# Patient Record
Sex: Male | Born: 1945 | Race: Black or African American | Hispanic: No | Marital: Married | Smoking: Never smoker
Health system: Southern US, Community
[De-identification: ages and names within clinical notes are randomized; demographics above are authoritative.]

## PROBLEM LIST (undated history)

## (undated) DIAGNOSIS — Z8739 Personal history of other diseases of the musculoskeletal system and connective tissue: Secondary | ICD-10-CM

## (undated) DIAGNOSIS — L819 Disorder of pigmentation, unspecified: Secondary | ICD-10-CM

## (undated) DIAGNOSIS — I10 Essential (primary) hypertension: Secondary | ICD-10-CM

## (undated) DIAGNOSIS — E119 Type 2 diabetes mellitus without complications: Secondary | ICD-10-CM

## (undated) DIAGNOSIS — C61 Malignant neoplasm of prostate: Secondary | ICD-10-CM

## (undated) HISTORY — PX: CATARACT EXTRACTION: SUR2

---

## 2000-05-27 ENCOUNTER — Emergency Department (HOSPITAL_COMMUNITY): Admission: AC | Admit: 2000-05-27 | Discharge: 2000-05-27 | Payer: Self-pay

## 2002-01-29 ENCOUNTER — Ambulatory Visit (HOSPITAL_COMMUNITY): Admission: RE | Admit: 2002-01-29 | Discharge: 2002-01-29 | Payer: Self-pay | Admitting: *Deleted

## 2006-07-30 ENCOUNTER — Encounter: Admission: RE | Admit: 2006-07-30 | Discharge: 2006-07-30 | Payer: Self-pay | Admitting: Internal Medicine

## 2015-03-24 ENCOUNTER — Other Ambulatory Visit: Payer: Self-pay | Admitting: Urology

## 2015-03-25 ENCOUNTER — Other Ambulatory Visit: Payer: Self-pay | Admitting: Urology

## 2015-04-08 NOTE — Patient Instructions (Addendum)
YOUR PROCEDURE IS SCHEDULED ON :  04/18/15  REPORT TO [place] MAIN ENTRANCE FOLLOW SIGNS TO EAST ELEVATOR - GO TO 3rd FLOOR CHECK IN AT 3 EAST NURSES STATION (SHORT STAY) AT:  9:30 AM  CALL THIS NUMBER IF YOU HAVE PROBLEMS THE MORNING OF SURGERY [phone]  REMEMBER:ONLY 1 PER PERSON MAY GO TO SHORT STAY WITH YOU TO GET READY THE MORNING OF YOUR SURGERY  DO NOT EAT FOOD OR DRINK LIQUIDS AFTER MIDNIGHT  TAKE THESE MEDICINES THE MORNING OF SURGERY: NONE  YOU MAY NOT HAVE ANY METAL ON YOUR BODY INCLUDING HAIR PINS AND PIERCING'S. DO NOT WEAR JEWELRY, MAKEUP, LOTIONS, POWDERS OR PERFUMES. DO NOT WEAR NAIL POLISH. DO NOT SHAVE 48 HRS PRIOR TO SURGERY. MEN MAY SHAVE FACE AND NECK.  DO NOT [place]. [facility] IS NOT RESPONSIBLE FOR VALUABLES.  CONTACTS, DENTURES OR PARTIALS MAY NOT BE WORN TO SURGERY. LEAVE SUITCASE IN CAR. CAN BE BROUGHT TO ROOM AFTER SURGERY.  PATIENTS DISCHARGED THE DAY OF SURGERY WILL NOT BE ALLOWED TO DRIVE HOME.  PLEASE READ OVER THE FOLLOWING INSTRUCTION SHEETS _________________________________________________________________________________                                          Andrew Ho - PREPARING FOR SURGERY  Before surgery, you can play an important role.  Because skin is not sterile, your skin needs to be as free of germs as possible.  You can reduce the number of germs on your skin by washing with CHG (chlorahexidine gluconate) soap before surgery.  CHG is an antiseptic cleaner which kills germs and bonds with the skin to continue killing germs even after washing. Please DO NOT use if you have an allergy to CHG or antibacterial soaps.  If your skin becomes reddened/irritated stop using the CHG and inform your nurse when you arrive at Short Stay. Do not shave (including legs and underarms) for at least 48 hours prior to the first CHG shower.  You may shave your face. Please follow these instructions  carefully:   1.  Shower with CHG Soap the night before surgery and the  morning of Surgery.   2.  If you choose to wash your hair, wash your hair first as usual with your  normal  Shampoo.   3.  After you shampoo, rinse your hair and body thoroughly to remove the  shampoo.                                         4.  Use CHG as you would any other liquid soap.  You can apply chg directly  to the skin and wash . Gently wash with scrungie or clean wascloth    5.  Apply the CHG Soap to your body ONLY FROM THE NECK DOWN.   Do not use on open                           Wound or open sores. Avoid contact with eyes, ears mouth and genitals (private parts).                        Genitals (private parts) with your normal soap.  6.  Wash thoroughly, paying special attention to the area where your surgery  will be performed.   7.  Thoroughly rinse your body with warm water from the neck down.   8.  DO NOT shower/wash with your normal soap after using and rinsing off  the CHG Soap .                9.  Pat yourself dry with a clean towel.             10.  Wear clean night clothes to bed after shower             11.  Place clean sheets on your bed the night of your first shower and do not  sleep with pets.  Day of Surgery : Do not apply any lotions/deodorants the morning of surgery.  Please wear clean clothes to the hospital/surgery center.  FAILURE TO FOLLOW THESE INSTRUCTIONS MAY RESULT IN THE CANCELLATION OF YOUR SURGERY    PATIENT SIGNATURE_________________________________  ______________________________________________________________________     Andrew Ho  An incentive spirometer is a tool that can help keep your lungs clear and active. This tool measures how well you are filling your lungs with each breath. Taking long deep breaths may help reverse or decrease the chance of developing breathing (pulmonary) problems (especially infection) following:  A long  period of time when you are unable to move or be active. BEFORE THE PROCEDURE   If the spirometer includes an indicator to show your best effort, your nurse or respiratory therapist will set it to a desired goal.  If possible, sit up straight or lean slightly forward. Try not to slouch.  Hold the incentive spirometer in an upright position. INSTRUCTIONS FOR USE   Sit on the edge of your bed if possible, or sit up as far as you can in bed or on a chair.  Hold the incentive spirometer in an upright position.  Breathe out normally.  Place the mouthpiece in your mouth and seal your lips tightly around it.  Breathe in slowly and as deeply as possible, raising the piston or the ball toward the top of the column.  Hold your breath for 3-5 seconds or for as long as possible. Allow the piston or ball to fall to the bottom of the column.  Remove the mouthpiece from your mouth and breathe out normally.  Rest for a few seconds and repeat Steps 1 through 7 at least 10 times every 1-2 hours when you are awake. Take your time and take a few normal breaths between deep breaths.  The spirometer may include an indicator to show your best effort. Use the indicator as a goal to work toward during each repetition.  After each set of 10 deep breaths, practice coughing to be sure your lungs are clear. If you have an incision (the cut made at the time of surgery), support your incision when coughing by placing a pillow or rolled up towels firmly against it. Once you are able to get out of bed, walk around indoors and cough well. You may stop using the incentive spirometer when instructed by your caregiver.  RISKS AND COMPLICATIONS  Take your time so you do not get dizzy or light-headed.  If you are in pain, you may need to take or ask for pain medication before doing incentive spirometry. It is harder to take a deep breath if you are having pain. AFTER USE  Rest and breathe slowly and easily.  It can  be helpful to keep track of a log of your progress. Your caregiver can provide you with a simple table to help with this. If you are using the spirometer at home, follow these instructions: Andrew Ho IF:   You are having difficultly using the spirometer.  You have trouble using the spirometer as often as instructed.  Your pain medication is not giving enough relief while using the spirometer.  You develop fever of 100.5 F (38.1 C) or higher. SEEK IMMEDIATE MEDICAL CARE IF:   You cough up bloody sputum that had not been present before.  You develop fever of 102 F (38.9 C) or greater.  You develop worsening pain at or near the incision site. MAKE SURE YOU:   Understand these instructions.  Will watch your condition.  Will get help right away if you are not doing well or get worse. Document Released: 05/21/2006 Document Revised: 04/02/2011 Document Reviewed: 07/22/2006 Exit[employer] Patient Information 2014 [employer], [place].   ________________________________________________________________________  WHAT IS A BLOOD TRANSFUSION? Blood Transfusion Information  A transfusion is the replacement of blood or some of its parts. Blood is made up of multiple cells which provide different functions.  Red blood cells carry oxygen and are used for blood loss replacement.  White blood cells fight against infection.  Platelets control bleeding.  Plasma helps clot blood.  Other blood products are available for specialized needs, such as hemophilia or other clotting disorders. BEFORE THE TRANSFUSION  Who gives blood for transfusions?   Healthy volunteers who are fully evaluated to make sure their blood is safe. This is blood bank blood. Transfusion therapy is the safest it has ever been in the practice of medicine. Before blood is taken from a donor, a complete history is taken to make sure that person has no history of diseases nor engages in risky social behavior (examples are  intravenous drug use or sexual activity with multiple partners). The donor's travel history is screened to minimize risk of transmitting infections, such as malaria. The donated blood is tested for signs of infectious diseases, such as HIV and hepatitis. The blood is then tested to be sure it is compatible with you in order to minimize the chance of a transfusion reaction. If you or a relative donates blood, this is often done in anticipation of surgery and is not appropriate for emergency situations. It takes many days to process the donated blood. RISKS AND COMPLICATIONS Although transfusion therapy is very safe and saves many lives, the main dangers of transfusion include:   Getting an infectious disease.  Developing a transfusion reaction. This is an allergic reaction to something in the blood you were given. Every precaution is taken to prevent this. The decision to have a blood transfusion has been considered carefully by your caregiver before blood is given. Blood is not given unless the benefits outweigh the risks. AFTER THE TRANSFUSION  Right after receiving a blood transfusion, you will usually feel much better and more energetic. This is especially true if your red blood cells have gotten low (anemic). The transfusion raises the level of the red blood cells which carry oxygen, and this usually causes an energy increase.  The nurse administering the transfusion will monitor you carefully for complications. HOME CARE INSTRUCTIONS  No special instructions are needed after a transfusion. You may find your energy is better. Speak with your caregiver about any limitations on activity for underlying diseases you may have. SEEK MEDICAL CARE IF:   Your  condition is not improving after your transfusion.  You develop redness or irritation at the intravenous (IV) site. SEEK IMMEDIATE MEDICAL CARE IF:  Any of the following symptoms occur over the next 12 hours:  Shaking chills.  You have a  temperature by mouth above 102 F (38.9 C), not controlled by medicine.  Chest, back, or muscle pain.  People around you feel you are not acting correctly or are confused.  Shortness of breath or difficulty breathing.  Dizziness and fainting.  You get a rash or develop hives.  You have a decrease in urine output.  Your urine turns a dark color or changes to pink, red, or brown. Any of the following symptoms occur over the next 10 days:  You have a temperature by mouth above 102 F (38.9 C), not controlled by medicine.  Shortness of breath.  Weakness after normal activity.  The white part of the eye turns yellow (jaundice).  You have a decrease in the amount of urine or are urinating less often.  Your urine turns a dark color or changes to pink, red, or brown. Document Released: 01/06/2000 Document Revised: 04/02/2011 Document Reviewed: 08/25/2007 [facility] Patient Information 2014 [place], [place].  _______________________________________________________________________

## 2015-04-11 ENCOUNTER — Encounter (HOSPITAL_COMMUNITY): Payer: Self-pay

## 2015-04-11 ENCOUNTER — Ambulatory Visit (HOSPITAL_COMMUNITY)
Admission: RE | Admit: 2015-04-11 | Discharge: 2015-04-11 | Disposition: A | Discharge status: Home / Self Care | Payer: BC Managed Care – PPO | Source: Ambulatory Visit | Attending: Anesthesiology | Admitting: Anesthesiology

## 2015-04-11 ENCOUNTER — Encounter (HOSPITAL_COMMUNITY)
Admission: RE | Admit: 2015-04-11 | Discharge: 2015-04-11 | Disposition: A | Discharge status: Home / Self Care | Payer: BC Managed Care – PPO | Source: Ambulatory Visit | Attending: Urology | Admitting: Urology

## 2015-04-11 DIAGNOSIS — I517 Cardiomegaly: Secondary | ICD-10-CM | POA: Insufficient documentation

## 2015-04-11 DIAGNOSIS — C61 Malignant neoplasm of prostate: Secondary | ICD-10-CM | POA: Insufficient documentation

## 2015-04-11 DIAGNOSIS — R918 Other nonspecific abnormal finding of lung field: Secondary | ICD-10-CM | POA: Insufficient documentation

## 2015-04-11 HISTORY — DX: Type 2 diabetes mellitus without complications: E11.9

## 2015-04-11 HISTORY — DX: Personal history of other diseases of the musculoskeletal system and connective tissue: Z87.39

## 2015-04-11 HISTORY — DX: Disorder of pigmentation, unspecified: L81.9

## 2015-04-11 HISTORY — DX: Malignant neoplasm of prostate: C61

## 2015-04-11 HISTORY — DX: Essential (primary) hypertension: I10

## 2015-04-11 LAB — CBC
HCT: 45.1 % (ref 39.0–52.0)
HEMOGLOBIN: 15 g/dL (ref 13.0–17.0)
MCH: 28.4 pg (ref 26.0–34.0)
MCHC: 33.3 g/dL (ref 30.0–36.0)
MCV: 85.3 fL (ref 78.0–100.0)
Platelets: 225 10*3/uL (ref 150–400)
RBC: 5.29 MIL/uL (ref 4.22–5.81)
RDW: 15.1 % (ref 11.5–15.5)
WBC: 8.3 10*3/uL (ref 4.0–10.5)

## 2015-04-11 LAB — BASIC METABOLIC PANEL
ANION GAP: 10 (ref 5–15)
BUN: 18 mg/dL (ref 6–20)
CALCIUM: 9.4 mg/dL (ref 8.9–10.3)
CO2: 24 mmol/L (ref 22–32)
CREATININE: 1.14 mg/dL (ref 0.61–1.24)
Chloride: 105 mmol/L (ref 101–111)
GFR calc Af Amer: >60 mL/min (ref >60)
GLUCOSE: 117 mg/dL — AB (ref 65–99)
Potassium: 4.6 mmol/L (ref 3.5–5.1)
Sodium: 139 mmol/L (ref 135–145)

## 2015-04-11 LAB — ABO/RH: ABO/RH(D): A POS

## 2015-04-11 NOTE — Progress Notes (Signed)
   04/11/15 1000  OBSTRUCTIVE SLEEP APNEA  Have you ever been diagnosed with sleep apnea through a sleep study? No  Do you snore loudly (loud enough to be heard through closed doors)?  1  Do you often feel tired, fatigued, or sleepy during the daytime (such as falling asleep during driving or talking to someone)? 0  Has anyone observed you stop breathing during your sleep? 0  Do you have, or are you being treated for high blood pressure? 1  BMI more than 35 kg/m2? 1  Age > 50 (1-yes) 1  Neck circumference greater than:Male 16 inches or larger, Male 17inches or larger? 1  Male Gender (Yes=1) 1  Obstructive Sleep Apnea Score 6  Score 5 or greater  Results sent to PCP

## 2015-04-12 LAB — HEMOGLOBIN A1C
HEMOGLOBIN A1C: 6.8 % — AB (ref 4.8–5.6)
MEAN PLASMA GLUCOSE: 148 mg/dL

## 2015-04-15 NOTE — H&P (Signed)
Chief Complaint Prostate Cancer   Reason For Visit Reason for consult: To discuss treatment options for prostate cancer and specifically to consider a robotic prostatectomy.  Physician requesting consult: Dr. Eda Keys  PCP: Dr. Zada Girt   History of Present Illness Andrew Ho is a 70 year old gentleman who was found to have an elevated PSA of 4.13 prompting a TRUS biopsy of the prostate on 02/16/15. This confirmed Gleason 4+3=7 adenocarcinoma of the prostate with 4 out of 12 biopsy cores positive for malignancy. He underwent a staging CT scan of the pelvis on 02/28/15 that was negative for regional lymphadenopathy. He has no family history of prostate cancer.    ** He is healthy overall. His PMH is significant for gout, hypertension, and hyperlipidemia. He also has type 2 diabetes originally diagnosed in October 2015. This has been well controlled.    TNM stage: cT1c N0 Mx  PSA: 4.13  Gleason score: 4+3=7  Biopsy (02/16/15): 4/12 cores    Left: Benign     Right: R mid (30%, 3+3=6, PNI), R mid lateral (20%, 3+3=6), R base (30%, 4+3=7, PNI), R base lateral (60%, 3+4=7)  Prostate volume: 40.7 cc    Nomogram  OC disease: 40%  EPE: 57%  SVI: 7%  LNI: 7%  PFS (surgery): 75% at 5 years, 61% at 10 years    Urinary function: He has minimal baseline lower urinary tract symptoms. IPSS is 6.  Erectile function: He has erectile dysfunction that has been responsive to PDE-5 inhibitors. He does have mild erectile dysfunction although he is wife have been sexually inactive over the past year. SHIM score is 3. This is a low priority to him and his wife.   Past Medical History Problems  1. History of gout (Z87.39) 2. History of hypercholesterolemia (Z86.39) 3. History of hypertension (Z86.79)  Surgical History Problems  1. History of No Surgical Problems  Current Meds 1. Allopurinol 300 MG Oral Tablet;  Therapy: 20Oct2014 to Recorded 2. Aspirin 81 MG TABS;  Therapy:  (Recorded:11Jul2016) to Recorded 3. Atenolol 50 MG Oral Tablet;  Therapy: [id number] to Recorded 4. Atorvastatin Calcium 20 MG Oral Tablet;  Therapy: (Recorded:01Jul2013) to Recorded 5. Colcrys 0.6 MG Oral Tablet;  Therapy: [id number] to Recorded 6. Losartan Potassium TABS;  Therapy: (Recorded:08Feb2017) to Recorded 7. MetFORMIN HCl - 500 MG Oral Tablet;  Therapy: (Recorded:11Jul2016) to Recorded  Allergies Medication  1. No Known Drug Allergies  Family History Problems  1. Family history of Death In The Family Father : Father   age 80; dementia 2. Family history of Family Health Status Number Of Children   1 son; 1 daughter  Social History Problems    Denied: History of Alcohol Use (History)   Family history of Death In The Family Father   90, heart failure   Marital History - Currently Married   Never A Smoker   Occupation:   Engineer, structural   Denied: History of Tobacco Use  Review of Systems Genitourinary, constitutional, skin, eye, otolaryngeal, hematologic/lymphatic, cardiovascular, pulmonary, endocrine, musculoskeletal, gastrointestinal, neurological and psychiatric system(s) were reviewed and pertinent findings if present are noted and are otherwise negative.    Vitals Vital Signs [Data Includes: Last 1 Day]  Recorded: 28Feb2017 08:23AM  Weight: 227 lb  BMI Calculated: 34.52 BSA Calculated: 2.16 Blood Pressure: 139 / 69 Heart Rate: 64  Physical Exam Constitutional: Well nourished and well developed . No acute distress.  ENT:. The ears and nose are normal in appearance.  Neck: The appearance of  the neck is normal and no neck mass is present.  Pulmonary: No respiratory distress and normal respiratory rhythm and effort.  Cardiovascular: Heart rate and rhythm are normal . No peripheral edema.  Abdomen: The abdomen is mildly obese. The abdomen is soft and nontender. No masses are palpated. No CVA tenderness. No hernias are palpable. No hepatosplenomegaly  noted.  Rectal: Rectal exam demonstrates normal sphincter tone, no tenderness and no masses. Prostate size is estimated to be 45 g. The prostate has no nodularity and is not tender. The left seminal vesicle is nonpalpable. The right seminal vesicle is nonpalpable. The perineum is normal on inspection.  Lymphatics: The femoral and inguinal nodes are not enlarged or tender.  Skin: Normal skin turgor, no visible rash and no visible skin lesions.  Neuro/Psych:. Mood and affect are appropriate.    Results/Data Selected Results  UA With REFLEX [id number] 08:14AM Raynelle Bring  SPECIMEN TYPE: [place]   Test Name Result Flag Reference  COLOR YELLOW  YELLOW  ** PLEASE NOTE CHANGE IN UNIT OF MEASURE AND REFERENCE RANGE(S). **  APPEARANCE CLEAR  CLEAR  SPECIFIC GRAVITY 1.025  1.001-1.035  pH 6.0  5.0-8.0  GLUCOSE NEGATIVE  NEGATIVE  BILIRUBIN NEGATIVE  NEGATIVE  KETONE NEGATIVE  NEGATIVE  BLOOD 1+ A NEGATIVE  PROTEIN 1+ A NEGATIVE  NITRITE NEGATIVE  NEGATIVE  LEUKOCYTE ESTERASE NEGATIVE  NEGATIVE  SQUAMOUS EPITHELIAL/HPF 0-5 HPF  <=5  WBC 0-5 WBC/HPF  <=5  RBC 0-2 RBC/HPF  <=2  BACTERIA NONE SEEN HPF  NONE SEEN  CRYSTALS NONE SEEN HPF  NONE SEEN  CASTS See Below LPF A NONE SEEN  HYALINE CAST                         1-3       ABN   NONE SEEN     LPF  Other     FEW MUCOUS THREADS  Yeast NONE SEEN HPF  NONE SEEN   BUN & CREATININE [id number] 09:45AM Festus Aloe  SPECIMEN TYPE: BLOOD   Test Name Result Flag Reference  CREATININE 1.10 mg/dL  0.50-1.50  BUN 17 mg/dL  6-30  Est GFR, African American 80 mL/min  >=60  PERFORMED AT:        [facility]                      [place].                      [place], [place] [address]  Est GFR, NonAfrican American 69 mL/min  >=60  THE ESTIMATED GFR IS A CALCULATION VALID FOR ADULTS (>=70 YEARS OLD) THAT USES THE CKD-EPI ALGORITHM TO ADJUST FOR AGE AND SEX. IT IS   NOT TO BE USED FOR CHILDREN, PREGNANT WOMEN, HOSPITALIZED  PATIENTS,    PATIENTS ON DIALYSIS, OR WITH RAPIDLY CHANGING KIDNEY FUNCTION. ACCORDING TO THE NKDEP, EGFR >89 IS NORMAL, 60-89 SHOWS MILD IMPAIRMENT, 30-59 SHOWS MODERATE IMPAIRMENT, 15-29 SHOWS SEVERE IMPAIRMENT AND <15 IS ESRD.   CT-PELVIS WITH CONTRAST [id number] 12:00AM Festus Aloe   Test Name Result Flag Reference  CT-PELVIS WITH CONTRAST (Report)    ** RADIOLOGY REPORT BY [facility] **   CLINICAL DATA: Prostate cancer. Rising PSA. Benign prostatic hypertrophy.  EXAM: CT PELVIS WITH CONTRAST  TECHNIQUE: Multidetector CT imaging of the pelvis was performed using the standard protocol following the bolus administration of intravenous contrast.  CONTRAST:  100 cc Isovue-300  COMPARISON: None.  FINDINGS: Lower urinary Tract: Unremarkable  Pelvic bowel: Unremarkable. Appendix normal.  Vascular/Lymphatic: Aortoiliac atherosclerotic vascular disease. 9 mm right external iliac node on image 29 series 2 has a fatty hilum. No overtly pathologic pelvic adenopathy.  Reproductive: Seminal vesicles are reasonably symmetric. Prostate gland is slightly asymmetric with more prominence on the left than the right, and speckled internal calcifications. CT estimate of prostate volume approximately 58 cc. I do not see definite extra prostatic extension locally although MRI is more sensitive (although best employed at least 6 weeks out from biopsy).  Other: No supplemental non-categorized findings.  Musculoskeletal: Small left inguinal hernia contains adipose tissue. Minimal sclerosis along the pubic bodies. Lower lumbar spondylosis and degenerative disc disease most notable at L4-5 with mild left and borderline right foraminal stenosis at L4-5. No findings of sclerotic bony metastatic disease to the pelvis, proximal femurs, or lower lumbar spine.  IMPRESSION: 1. No pathologic adenopathy or evidence of sclerotic metastatic disease to the pelvis. 2. Prostate  volume estimated at 58 cc. The gland is slightly asymmetric to the left although some of this could be due to post biopsy hematoma in the prostate gland. No definite extra prostatic extension is apparent on CT, although MRI is more sensitive. 3. Small left inguinal hernia contains adipose tissue. 4. Lower lumbar spondylosis and degenerative disc disease causing mild impingement at L4-5 on the left.   Electronically Signed  By: Van Clines M.D.  On: 02/28/2015 11:45   PSA REFLEX TO FREE 03Oct2016 09:13AM Festus Aloe  SPECIMEN TYPE: BLOOD   Test Name Result Flag Reference  PSA 4.13 ng/mL H <=4.00  RESULT REPEATED AND VERIFIED. TEST METHODOLOGY: ECLIA PSA (ELECTROCHEMILUMINESCENCE IMMUNOASSAY)  PSA, FREE 0.48 ng/mL    PSA, %FREE 12 % L > 25  PROBABILITY OF PROSTATE CANCER   (FOR MEN WITH NON-SUSPICIOUS DRE RESULTS AND PSA BETWEEN 4 AND   10 NG/ML, BY PATIENT AGE)     % FREE PSA                          PATIENT AGE                          50 TO 59 YEARS  60 TO 69 YEARS  >70 YEARS    <=10%                  49.2%           57.5%          64.5%    11 - 18%               26.9%           33.9%          40.8%    19 - 25%               18.3%           23.9%          29.7%    >25%                    9.1%           12.2%          15.8%    I independently reviewed his medical records, PSA results, and pathology report. I'll also independently reviewed his CT scan. Findings are  as dictated above.  Assessment Assessed  1. Prostate cancer (C61)  Plan  Prostate cancer  1. Follow-up Office  Follow-up  Status: Complete  Done: [id number] 2. PT/OT Referral Referral  Referral  Status: Hold For - PreCert,Date of Service,Physical  Therapy  Requested for: [id number]  Discussion/Summary 1. Prostate cancer: I had a detailed discussion with Andrew Ho and his wife today regarding options for management of his prostate cancer. I did recommend therapy of curative intent considering his life  expectancy and his disease parameters. I did offer him a radiation oncology consultation.   The patient was counseled about the natural history of prostate cancer and the standard treatment options that are available for prostate cancer. It was explained to him how his age and life expectancy, clinical stage, Gleason score, and PSA affect his prognosis, the decision to proceed with additional staging studies, as well as how that information influences recommended treatment strategies. We discussed the roles for active surveillance, radiation therapy, surgical therapy, androgen deprivation, as well as ablative therapy options for the treatment of prostate cancer as appropriate to his individual cancer situation. We discussed the risks and benefits of these options with regard to their impact on cancer control and also in terms of potential adverse events, complications, and impact on quiality of life particularly related to urinary, bowel, and sexual function. The patient was encouraged to ask questions throughout the discussion today and all questions were answered to his stated satisfaction. In addition, the patient was provided with and/or directed to appropriate resources and literature for further education about prostate cancer and treatment options.   We discussed surgical therapy for prostate cancer including the different available surgical approaches. We discussed, in detail, the risks and expectations of surgery with regard to cancer control, urinary control, and erectile function as well as the expected postoperative recovery process. Additional risks of surgery including but not limited to bleeding, infection, hernia formation, nerve damage, lymphocele formation, bowel/rectal injury potentially necessitating colostomy, damage to the urinary tract resulting in urine leakage, urethral stricture, and the cardiopulmonary risks such as myocardial infarction, stroke, death, venothromboembolism, etc. were  explained. The risk of open surgical conversion for robotic/laparoscopic prostatectomy was also discussed.     After discussion, he does adamantly wished to proceed with surgical therapy. He will be scheduled for a bilateral nerve sparing robot-assisted laparoscopic radical prostatectomy and pelvic lymphadenectomy.    Cc: Dr. Eda Keys  Dr. Zada Girt  A total of 65 minutes were spent in the overall care of the patient today with 55 minutes in direct face to face consultation.    Signatures Electronically signed by : Raynelle Bring, M.D.; Mar 22 2015  5:45PM EST

## 2015-04-18 ENCOUNTER — Encounter (HOSPITAL_COMMUNITY)
Admission: AD | Disposition: A | Discharge status: Home / Self Care | Payer: Self-pay | Source: Ambulatory Visit | Attending: Urology

## 2015-04-18 ENCOUNTER — Inpatient Hospital Stay (HOSPITAL_COMMUNITY)
Admission: AD | Admit: 2015-04-18 | Discharge: 2015-04-19 | DRG: 708 | Disposition: A | Discharge status: Home / Self Care | Payer: BC Managed Care – PPO | Source: Ambulatory Visit | Attending: Urology | Admitting: Urology

## 2015-04-18 ENCOUNTER — Inpatient Hospital Stay (HOSPITAL_COMMUNITY): Payer: BC Managed Care – PPO | Admitting: Certified Registered"

## 2015-04-18 ENCOUNTER — Encounter (HOSPITAL_COMMUNITY): Payer: Self-pay | Admitting: *Deleted

## 2015-04-18 DIAGNOSIS — I10 Essential (primary) hypertension: Secondary | ICD-10-CM | POA: Diagnosis present

## 2015-04-18 DIAGNOSIS — Z79899 Other long term (current) drug therapy: Secondary | ICD-10-CM | POA: Diagnosis not present

## 2015-04-18 DIAGNOSIS — N529 Male erectile dysfunction, unspecified: Secondary | ICD-10-CM | POA: Diagnosis present

## 2015-04-18 DIAGNOSIS — M109 Gout, unspecified: Secondary | ICD-10-CM | POA: Diagnosis present

## 2015-04-18 DIAGNOSIS — E7800 Pure hypercholesterolemia, unspecified: Secondary | ICD-10-CM | POA: Diagnosis present

## 2015-04-18 DIAGNOSIS — Z7982 Long term (current) use of aspirin: Secondary | ICD-10-CM

## 2015-04-18 DIAGNOSIS — Z6834 Body mass index (BMI) 34.0-34.9, adult: Secondary | ICD-10-CM | POA: Diagnosis not present

## 2015-04-18 DIAGNOSIS — C61 Malignant neoplasm of prostate: Secondary | ICD-10-CM | POA: Diagnosis present

## 2015-04-18 DIAGNOSIS — E119 Type 2 diabetes mellitus without complications: Secondary | ICD-10-CM | POA: Diagnosis present

## 2015-04-18 HISTORY — PX: LYMPHADENECTOMY: SHX5960

## 2015-04-18 HISTORY — PX: ROBOT ASSISTED LAPAROSCOPIC RADICAL PROSTATECTOMY: SHX5141

## 2015-04-18 LAB — HEMOGLOBIN AND HEMATOCRIT, BLOOD
HCT: 40.6 % (ref 39.0–52.0)
Hemoglobin: 13.2 g/dL (ref 13.0–17.0)

## 2015-04-18 LAB — GLUCOSE, CAPILLARY
GLUCOSE-CAPILLARY: 180 mg/dL — AB (ref 65–99)
Glucose-Capillary: 148 mg/dL — ABNORMAL HIGH (ref 65–99)
Glucose-Capillary: 226 mg/dL — ABNORMAL HIGH (ref 65–99)

## 2015-04-18 LAB — TYPE AND SCREEN
ABO/RH(D): A POS
ANTIBODY SCREEN: NEGATIVE

## 2015-04-18 SURGERY — XI ROBOTIC ASSISTED LAPAROSCOPIC RADICAL PROSTATECTOMY LEVEL 2
Anesthesia: General

## 2015-04-18 MED ORDER — LACTATED RINGERS IV SOLN
INTRAVENOUS | Status: DC
  Administered 2015-04-18: 1000 mL via INTRAVENOUS

## 2015-04-18 MED ORDER — DIPHENHYDRAMINE HCL 50 MG/ML IJ SOLN: 12.5000 mg | Freq: Four times a day (QID) | INTRAMUSCULAR | Status: DC | PRN

## 2015-04-18 MED ORDER — MIDAZOLAM HCL 2 MG/2ML IJ SOLN
INTRAMUSCULAR | Status: AC
  Filled 2015-04-18: qty 2

## 2015-04-18 MED ORDER — ACETAMINOPHEN 325 MG PO TABS: 650.0000 mg | ORAL_TABLET | ORAL | Status: DC | PRN

## 2015-04-18 MED ORDER — SUCCINYLCHOLINE CHLORIDE 20 MG/ML IJ SOLN
INTRAMUSCULAR | Status: DC | PRN
  Administered 2015-04-18: 100 mg via INTRAVENOUS

## 2015-04-18 MED ORDER — ROCURONIUM BROMIDE 100 MG/10ML IV SOLN
INTRAVENOUS | Status: DC | PRN
  Administered 2015-04-18 (×2): 10 mg via INTRAVENOUS
  Administered 2015-04-18: 70 mg via INTRAVENOUS
  Administered 2015-04-18: 10 mg via INTRAVENOUS
  Administered 2015-04-18: 20 mg via INTRAVENOUS

## 2015-04-18 MED ORDER — ALLOPURINOL 300 MG PO TABS
300.0000 mg | ORAL_TABLET | Freq: Every day | ORAL | Status: DC
Start: 2015-04-18 — End: 2015-04-19
  Administered 2015-04-18: 300 mg via ORAL
  Filled 2015-04-18 (×3): qty 1

## 2015-04-18 MED ORDER — KETOROLAC TROMETHAMINE 15 MG/ML IJ SOLN
15.0000 mg | Freq: Four times a day (QID) | INTRAMUSCULAR | Status: DC
  Administered 2015-04-18 – 2015-04-19 (×2): 15 mg via INTRAVENOUS
  Filled 2015-04-18 (×3): qty 1

## 2015-04-18 MED ORDER — LACTATED RINGERS IV SOLN
INTRAVENOUS | Status: DC | PRN
  Administered 2015-04-18: 1 mL

## 2015-04-18 MED ORDER — DEXAMETHASONE SODIUM PHOSPHATE 10 MG/ML IJ SOLN
INTRAMUSCULAR | Status: DC | PRN
  Administered 2015-04-18: 5 mg via INTRAVENOUS

## 2015-04-18 MED ORDER — BUPIVACAINE-EPINEPHRINE (PF) 0.25% -1:200000 IJ SOLN
INTRAMUSCULAR | Status: AC
  Filled 2015-04-18: qty 30

## 2015-04-18 MED ORDER — SUGAMMADEX SODIUM 200 MG/2ML IV SOLN
INTRAVENOUS | Status: DC | PRN
  Administered 2015-04-18: 200 mg via INTRAVENOUS

## 2015-04-18 MED ORDER — HYDROMORPHONE HCL 1 MG/ML IJ SOLN
INTRAMUSCULAR | Status: AC
  Filled 2015-04-18: qty 1

## 2015-04-18 MED ORDER — SODIUM CHLORIDE 0.9 % IR SOLN
Status: DC | PRN
Start: 2015-04-18 — End: 2015-04-18
  Administered 2015-04-18: 1000 mL

## 2015-04-18 MED ORDER — SODIUM CHLORIDE 0.9 % IV BOLUS (SEPSIS)
1000.0000 mL | Freq: Once | INTRAVENOUS | Status: AC
  Administered 2015-04-18: 1000 mL via INTRAVENOUS

## 2015-04-18 MED ORDER — FENTANYL CITRATE (PF) 250 MCG/5ML IJ SOLN
INTRAMUSCULAR | Status: AC
  Filled 2015-04-18: qty 5

## 2015-04-18 MED ORDER — LIDOCAINE HCL (CARDIAC) 20 MG/ML IV SOLN
INTRAVENOUS | Status: AC
  Filled 2015-04-18: qty 5

## 2015-04-18 MED ORDER — EPHEDRINE SULFATE 50 MG/ML IJ SOLN
INTRAMUSCULAR | Status: DC | PRN
  Administered 2015-04-18 (×2): 5 mg via INTRAVENOUS

## 2015-04-18 MED ORDER — PROPOFOL 10 MG/ML IV BOLUS
INTRAVENOUS | Status: DC | PRN
  Administered 2015-04-18: 200 mg via INTRAVENOUS

## 2015-04-18 MED ORDER — SODIUM CHLORIDE 0.9 % IJ SOLN
INTRAMUSCULAR | Status: AC
  Filled 2015-04-18: qty 10

## 2015-04-18 MED ORDER — HYDROCODONE-ACETAMINOPHEN 5-325 MG PO TABS: 1.0000 | ORAL_TABLET | Freq: Four times a day (QID) | ORAL | Status: DC | PRN

## 2015-04-18 MED ORDER — KCL IN DEXTROSE-NACL 20-5-0.45 MEQ/L-%-% IV SOLN
INTRAVENOUS | Status: DC
  Administered 2015-04-18: 150 mL/h via INTRAVENOUS
  Filled 2015-04-18 (×5): qty 1000

## 2015-04-18 MED ORDER — HEPARIN SODIUM (PORCINE) 1000 UNIT/ML IJ SOLN
INTRAMUSCULAR | Status: AC
  Filled 2015-04-18: qty 1

## 2015-04-18 MED ORDER — INSULIN ASPART 100 UNIT/ML ~~LOC~~ SOLN
0.0000 [IU] | SUBCUTANEOUS | Status: DC
  Administered 2015-04-18: 5 [IU] via SUBCUTANEOUS
  Administered 2015-04-19: 2 [IU] via SUBCUTANEOUS

## 2015-04-18 MED ORDER — ACETAMINOPHEN 325 MG PO TABS: 325.0000 mg | ORAL_TABLET | ORAL | Status: DC | PRN

## 2015-04-18 MED ORDER — PHENYLEPHRINE HCL 10 MG/ML IJ SOLN
INTRAMUSCULAR | Status: DC | PRN
  Administered 2015-04-18 (×3): 80 ug via INTRAVENOUS
  Administered 2015-04-18: 40 ug via INTRAVENOUS

## 2015-04-18 MED ORDER — HYDROMORPHONE HCL 1 MG/ML IJ SOLN: 0.2500 mg | INTRAMUSCULAR | Status: DC | PRN

## 2015-04-18 MED ORDER — DEXAMETHASONE SODIUM PHOSPHATE 10 MG/ML IJ SOLN
INTRAMUSCULAR | Status: AC
  Filled 2015-04-18: qty 1

## 2015-04-18 MED ORDER — CEFAZOLIN SODIUM 1-5 GM-% IV SOLN
1.0000 g | Freq: Three times a day (TID) | INTRAVENOUS | Status: AC
  Administered 2015-04-18 – 2015-04-19 (×2): 1 g via INTRAVENOUS
  Filled 2015-04-18 (×2): qty 50

## 2015-04-18 MED ORDER — ATENOLOL 50 MG PO TABS
50.0000 mg | ORAL_TABLET | Freq: Every day | ORAL | Status: DC
  Administered 2015-04-18: 50 mg via ORAL
  Filled 2015-04-18 (×3): qty 1

## 2015-04-18 MED ORDER — ATORVASTATIN CALCIUM 20 MG PO TABS
20.0000 mg | ORAL_TABLET | Freq: Every day | ORAL | Status: DC
  Administered 2015-04-18: 20 mg via ORAL
  Filled 2015-04-18: qty 2
  Filled 2015-04-18 (×2): qty 1
  Filled 2015-04-18: qty 2

## 2015-04-18 MED ORDER — FENTANYL CITRATE (PF) 100 MCG/2ML IJ SOLN
INTRAMUSCULAR | Status: DC | PRN
  Administered 2015-04-18 (×5): 50 ug via INTRAVENOUS

## 2015-04-18 MED ORDER — SUGAMMADEX SODIUM 200 MG/2ML IV SOLN
INTRAVENOUS | Status: AC
  Filled 2015-04-18: qty 2

## 2015-04-18 MED ORDER — PHENYLEPHRINE 40 MCG/ML (10ML) SYRINGE FOR IV PUSH (FOR BLOOD PRESSURE SUPPORT)
PREFILLED_SYRINGE | INTRAVENOUS | Status: AC
  Filled 2015-04-18: qty 10

## 2015-04-18 MED ORDER — LACTATED RINGERS IV SOLN
INTRAVENOUS | Status: DC | PRN
  Administered 2015-04-18 (×2): via INTRAVENOUS

## 2015-04-18 MED ORDER — DEXTROSE 5 % IV SOLN
2.0000 g | INTRAVENOUS | Status: AC
  Administered 2015-04-18: 2 g via INTRAVENOUS

## 2015-04-18 MED ORDER — ACETAMINOPHEN 160 MG/5ML PO SOLN: 325.0000 mg | ORAL | Status: DC | PRN

## 2015-04-18 MED ORDER — EPHEDRINE SULFATE 50 MG/ML IJ SOLN
INTRAMUSCULAR | Status: AC
  Filled 2015-04-18: qty 1

## 2015-04-18 MED ORDER — MIDAZOLAM HCL 5 MG/5ML IJ SOLN
INTRAMUSCULAR | Status: DC | PRN
  Administered 2015-04-18: 2 mg via INTRAVENOUS

## 2015-04-18 MED ORDER — OXYCODONE HCL 5 MG/5ML PO SOLN
5.0000 mg | Freq: Once | ORAL | Status: DC | PRN
  Filled 2015-04-18: qty 5

## 2015-04-18 MED ORDER — DOCUSATE SODIUM 100 MG PO CAPS
100.0000 mg | ORAL_CAPSULE | Freq: Two times a day (BID) | ORAL | Status: DC
  Administered 2015-04-18 – 2015-04-19 (×2): 100 mg via ORAL
  Filled 2015-04-18 (×2): qty 1

## 2015-04-18 MED ORDER — CEFAZOLIN SODIUM-DEXTROSE 2-3 GM-% IV SOLR
INTRAVENOUS | Status: AC
  Filled 2015-04-18: qty 50

## 2015-04-18 MED ORDER — MORPHINE SULFATE (PF) 2 MG/ML IV SOLN: 2.0000 mg | INTRAVENOUS | Status: DC | PRN

## 2015-04-18 MED ORDER — LOSARTAN POTASSIUM 50 MG PO TABS
50.0000 mg | ORAL_TABLET | Freq: Every day | ORAL | Status: DC
  Administered 2015-04-18: 50 mg via ORAL
  Filled 2015-04-18 (×3): qty 1

## 2015-04-18 MED ORDER — ONDANSETRON HCL 4 MG/2ML IJ SOLN
INTRAMUSCULAR | Status: DC | PRN
  Administered 2015-04-18: 2 mg via INTRAVENOUS
  Administered 2015-04-18: 4 mg via INTRAVENOUS
  Administered 2015-04-18: 2 mg via INTRAVENOUS

## 2015-04-18 MED ORDER — BUPIVACAINE-EPINEPHRINE 0.25% -1:200000 IJ SOLN
INTRAMUSCULAR | Status: DC | PRN
  Administered 2015-04-18: 20 mL

## 2015-04-18 MED ORDER — DIPHENHYDRAMINE HCL 12.5 MG/5ML PO ELIX: 12.5000 mg | ORAL_SOLUTION | Freq: Four times a day (QID) | ORAL | Status: DC | PRN

## 2015-04-18 MED ORDER — HYDROMORPHONE HCL 2 MG/ML IJ SOLN
INTRAMUSCULAR | Status: AC
  Filled 2015-04-18: qty 1

## 2015-04-18 MED ORDER — SULFAMETHOXAZOLE-TRIMETHOPRIM 800-160 MG PO TABS: 1.0000 | ORAL_TABLET | Freq: Two times a day (BID) | ORAL | Status: DC

## 2015-04-18 MED ORDER — OXYCODONE HCL 5 MG PO TABS: 5.0000 mg | ORAL_TABLET | Freq: Once | ORAL | Status: DC | PRN

## 2015-04-18 MED ORDER — PROPOFOL 10 MG/ML IV BOLUS
INTRAVENOUS | Status: AC
  Filled 2015-04-18: qty 20

## 2015-04-18 MED ORDER — ONDANSETRON HCL 4 MG/2ML IJ SOLN
INTRAMUSCULAR | Status: AC
  Filled 2015-04-18: qty 4

## 2015-04-18 MED ORDER — HYDROMORPHONE HCL 1 MG/ML IJ SOLN
INTRAMUSCULAR | Status: DC | PRN
  Administered 2015-04-18 (×2): 0.5 mg via INTRAVENOUS

## 2015-04-18 MED ORDER — LIDOCAINE HCL (CARDIAC) 20 MG/ML IV SOLN
INTRAVENOUS | Status: DC | PRN
  Administered 2015-04-18: 100 mg via INTRAVENOUS

## 2015-04-18 SURGICAL SUPPLY — 50 items
APPLICATOR COTTON TIP 6IN STRL (MISCELLANEOUS) ×3 IMPLANT
CATH FOLEY 2WAY SLVR 18FR 30CC (CATHETERS) ×3 IMPLANT
CATH ROBINSON RED A/P 16FR (CATHETERS) ×3 IMPLANT
CATH ROBINSON RED A/P 8FR (CATHETERS) ×3 IMPLANT
CATH TIEMANN FOLEY 18FR 5CC (CATHETERS) ×3 IMPLANT
CHLORAPREP W/TINT 26ML (MISCELLANEOUS) ×3 IMPLANT
CLIP LIGATING HEM O LOK PURPLE (MISCELLANEOUS) ×6 IMPLANT
COVER SURGICAL LIGHT HANDLE (MISCELLANEOUS) ×3 IMPLANT
COVER TIP SHEARS 8 DVNC (MISCELLANEOUS) ×2 IMPLANT
COVER TIP SHEARS 8MM DA VINCI (MISCELLANEOUS) ×1
CUTTER ECHEON FLEX ENDO 45 340 (ENDOMECHANICALS) ×3 IMPLANT
DECANTER SPIKE VIAL GLASS SM (MISCELLANEOUS) ×3 IMPLANT
DRAPE ARM DVNC X/XI (DISPOSABLE) ×8 IMPLANT
DRAPE COLUMN DVNC XI (DISPOSABLE) ×2 IMPLANT
DRAPE DA VINCI XI ARM (DISPOSABLE) ×4
DRAPE DA VINCI XI COLUMN (DISPOSABLE) ×1
DRAPE SURG IRRIG POUCH 19X23 (DRAPES) ×3 IMPLANT
DRSG TEGADERM 4X4.75 (GAUZE/BANDAGES/DRESSINGS) ×3 IMPLANT
ELECT REM PT RETURN 9FT ADLT (ELECTROSURGICAL) ×3
ELECTRODE REM PT RTRN 9FT ADLT (ELECTROSURGICAL) ×2 IMPLANT
GLOVE BIO SURGEON STRL SZ 6.5 (GLOVE) ×3 IMPLANT
GLOVE BIOGEL M STRL SZ7.5 (GLOVE) ×6 IMPLANT
GOWN STRL REUS W/TWL LRG LVL3 (GOWN DISPOSABLE) ×9 IMPLANT
HOLDER FOLEY CATH W/STRAP (MISCELLANEOUS) ×3 IMPLANT
IV LACTATED RINGERS 1000ML (IV SOLUTION) ×3 IMPLANT
LIQUID BAND (GAUZE/BANDAGES/DRESSINGS) ×3 IMPLANT
NDL SAFETY ECLIPSE 18X1.5 (NEEDLE) ×2 IMPLANT
NEEDLE HYPO 18GX1.5 SHARP (NEEDLE) ×1
PACK ROBOT UROLOGY CUSTOM (CUSTOM PROCEDURE TRAY) ×3 IMPLANT
RELOAD GREEN ECHELON 45 (STAPLE) ×3 IMPLANT
SEAL CANN UNIV 5-8 DVNC XI (MISCELLANEOUS) ×8 IMPLANT
SEAL XI 5MM-8MM UNIVERSAL (MISCELLANEOUS) ×4
SET TUBE IRRIG SUCTION NO TIP (IRRIGATION / IRRIGATOR) ×3 IMPLANT
SOLUTION ELECTROLUBE (MISCELLANEOUS) ×3 IMPLANT
SUT ETHILON 3 0 PS 1 (SUTURE) ×3 IMPLANT
SUT MNCRL 3 0 RB1 (SUTURE) ×2 IMPLANT
SUT MNCRL 3 0 VIOLET RB1 (SUTURE) ×2 IMPLANT
SUT MNCRL AB 4-0 PS2 18 (SUTURE) ×6 IMPLANT
SUT MONOCRYL 3 0 RB1 (SUTURE) ×2
SUT VIC AB 0 CT1 27 (SUTURE) ×1
SUT VIC AB 0 CT1 27XBRD ANTBC (SUTURE) ×2 IMPLANT
SUT VIC AB 0 UR5 27 (SUTURE) ×3 IMPLANT
SUT VIC AB 2-0 SH 27 (SUTURE) ×2
SUT VIC AB 2-0 SH 27X BRD (SUTURE) ×4 IMPLANT
SUT VICRYL 0 UR6 27IN ABS (SUTURE) ×6 IMPLANT
SYR 27GX1/2 1ML LL SAFETY (SYRINGE) ×3 IMPLANT
TOWEL OR 17X26 10 PK STRL BLUE (TOWEL DISPOSABLE) ×3 IMPLANT
TOWEL OR NON WOVEN STRL DISP B (DISPOSABLE) ×3 IMPLANT
TUBING INSUFFLATION 10FT LAP (TUBING) IMPLANT
WATER STERILE IRR 1500ML POUR (IV SOLUTION) ×3 IMPLANT

## 2015-04-18 NOTE — Progress Notes (Signed)
Patient ID: Andrew Ho, male   DOB: 22-Dec-1945, 70 y.o.   MRN: [id number]  Post-op note  Subjective: The patient is doing well.  No complaints.  Objective: Vital signs in last 24 hours: Temp:  [97.3 F (36.3 C)-97.9 F (36.6 C)] 97.8 F (36.6 C) (03/27 1714) Pulse Rate:  [74-88] 81 (03/27 1714) Resp:  [12-23] 15 (03/27 1714) BP: (137-152)/(65-86) 151/84 mmHg (03/27 1714) SpO2:  [96 %-100 %] 96 % (03/27 1714) Weight:  [105.688 kg (233 lb)] 105.688 kg (233 lb) (03/27 1714)  Intake/Output from previous day:   Intake/Output this shift: Total I/O In: 3625 [I.V.:2625; IV Piggyback:1000] Out: 190 [Urine:75; Drains:15; Blood:100]  Physical Exam:  General: Alert and oriented. Abdomen: Soft, Nondistended. Incisions: Clean and dry. GU: Urine clear  Lab Results:  Recent Labs  04/18/15 1613  HGB 13.2  HCT 40.6    Assessment/Plan: POD#0   1) Continue to monitor, ambulate, IS   Pryor Curia. MD   LOS: 0 days   Kinsleigh Ludolph,LES 04/18/2015, 5:31 PM

## 2015-04-18 NOTE — Anesthesia Preprocedure Evaluation (Addendum)
Anesthesia Evaluation  Patient identified by MRN, date of birth, ID band Patient awake    Reviewed: Allergy & Precautions, NPO status , Patient's Chart, lab work & pertinent test results, reviewed documented beta blocker date and time   History of Anesthesia Complications Negative for: history of anesthetic complications  Airway Mallampati: III   Neck ROM: Full    Dental  (+) Dental Advisory Given, Teeth Intact   Pulmonary neg pulmonary ROS,    breath sounds clear to auscultation       Cardiovascular hypertension, Pt. on medications and Pt. on home beta blockers (-) angina(-) Past MI and (-) CHF  Rhythm:Regular     Neuro/Psych negative neurological ROS  negative psych ROS   GI/Hepatic negative GI ROS, Neg liver ROS,   Endo/Other  diabetes, Type 2, Oral Hypoglycemic AgentsMorbid obesity  Renal/GU Renal InsufficiencyRenal disease     Musculoskeletal   Abdominal   Peds  Hematology negative hematology ROS (+)   Anesthesia Other Findings   Reproductive/Obstetrics                            Anesthesia Physical Anesthesia Plan  ASA: III  Anesthesia Plan: General   Post-op Pain Management:    Induction: Intravenous  Airway Management Planned: Oral ETT  Additional Equipment: None  Intra-op Plan:   Post-operative Plan: Extubation in OR  Informed Consent: I have reviewed the patients History and Physical, chart, labs and discussed the procedure including the risks, benefits and alternatives for the proposed anesthesia with the patient or authorized representative who has indicated his/her understanding and acceptance.   Dental advisory given  Plan Discussed with: CRNA and Surgeon  Anesthesia Plan Comments:         Anesthesia Quick Evaluation

## 2015-04-18 NOTE — Anesthesia Procedure Notes (Signed)
Procedure Name: Intubation Date/Time: 04/18/2015 11:54 AM Performed by: Freddie Breech Pre-anesthesia Checklist: Patient identified, Emergency Drugs available, Suction available, Patient being monitored and Timeout performed Patient Re-evaluated:Patient Re-evaluated prior to inductionOxygen Delivery Method: Circle system utilized Preoxygenation: Pre-oxygenation with 100% oxygen Intubation Type: IV induction Ventilation: Mask ventilation without difficulty, Two handed mask ventilation required and Oral airway inserted - appropriate to patient size Laryngoscope Size: Glidescope and 4 Grade View: Grade II Tube type: Oral Tube size: 7.5 mm Number of attempts: 1 Airway Equipment and Method: Patient positioned with wedge pillow and Stylet Placement Confirmation: ETT inserted through vocal cords under direct vision,  positive ETCO2,  CO2 detector and breath sounds checked- equal and bilateral Secured at: 22 cm Tube secured with: Tape Dental Injury: Teeth and Oropharynx as per pre-operative assessment

## 2015-04-18 NOTE — Discharge Instructions (Signed)

## 2015-04-18 NOTE — Interval H&P Note (Signed)
History and Physical Interval Note:  04/18/2015 10:12 AM  Andrew Ho  has presented today for surgery, with the diagnosis of PROSTATE CANCER  The various methods of treatment have been discussed with the patient and family. After consideration of risks, benefits and other options for treatment, the patient has consented to  Procedure(s): XI ROBOTIC ASSISTED LAPAROSCOPIC RADICAL PROSTATECTOMY LEVEL 2 (N/A) PELVIC LYMPHADENECTOMY (Bilateral) as a surgical intervention .  The patient's history has been reviewed, patient examined, no change in status, stable for surgery.  I have reviewed the patient's chart and labs.  Questions were answered to the patient's satisfaction.     Hala Narula,LES

## 2015-04-18 NOTE — Op Note (Signed)

## 2015-04-18 NOTE — Anesthesia Postprocedure Evaluation (Signed)
Anesthesia Post Note  Patient: Andrew Ho  Procedure(s) Performed: Procedure(s) (LRB): XI ROBOTIC ASSISTED LAPAROSCOPIC RADICAL PROSTATECTOMY LEVEL 2 (N/A) PELVIC LYMPHADENECTOMY (Bilateral)  Patient location during evaluation: PACU Anesthesia Type: General Level of consciousness: awake and alert Pain management: pain level controlled Vital Signs Assessment: post-procedure vital signs reviewed and stable Respiratory status: spontaneous breathing, nonlabored ventilation, respiratory function stable and patient connected to nasal cannula oxygen Cardiovascular status: blood pressure returned to baseline and stable Postop Assessment: no signs of nausea or vomiting Anesthetic complications: no    Last Vitals:  Filed Vitals:   04/18/15 0938 04/18/15 1610  BP: 141/86 140/77  Pulse: 74 88  Temp: 36.3 C 36.3 C  Resp: 18 16    Last Pain:  Filed Vitals:   04/18/15 1633  PainSc: 0-No pain                 Alexis Frock

## 2015-04-18 NOTE — Transfer of Care (Signed)
Immediate Anesthesia Transfer of Care Note  Patient: Andrew Ho  Procedure(s) Performed: Procedure(s): XI ROBOTIC ASSISTED LAPAROSCOPIC RADICAL PROSTATECTOMY LEVEL 2 (N/A) PELVIC LYMPHADENECTOMY (Bilateral)  Patient Location: PACU  Anesthesia Type:General  Level of Consciousness:  sedated, patient cooperative and responds to stimulation  Airway & Oxygen Therapy:Patient Spontanous Breathing and Patient connected to face mask oxgen  Post-op Assessment:  Report given to PACU RN and Post -op Vital signs reviewed and stable  Post vital signs:  Reviewed and stable  Last Vitals:  Filed Vitals:   04/18/15 0938  BP: 141/86  Pulse: 74  Temp: 36.3 C  Resp: 18    Complications: No apparent anesthesia complications

## 2015-04-19 LAB — GLUCOSE, CAPILLARY
Glucose-Capillary: 100 mg/dL — ABNORMAL HIGH (ref 65–99)
Glucose-Capillary: 107 mg/dL — ABNORMAL HIGH (ref 65–99)
Glucose-Capillary: 141 mg/dL — ABNORMAL HIGH (ref 65–99)
Glucose-Capillary: 197 mg/dL — ABNORMAL HIGH (ref 65–99)

## 2015-04-19 LAB — HEMOGLOBIN AND HEMATOCRIT, BLOOD
HCT: 38.1 % — ABNORMAL LOW (ref 39.0–52.0)
Hemoglobin: 12.5 g/dL — ABNORMAL LOW (ref 13.0–17.0)

## 2015-04-19 MED ORDER — HYDROCODONE-ACETAMINOPHEN 5-325 MG PO TABS: 1.0000 | ORAL_TABLET | Freq: Four times a day (QID) | ORAL | Status: DC | PRN

## 2015-04-19 MED ORDER — BISACODYL 10 MG RE SUPP
10.0000 mg | Freq: Once | RECTAL | Status: AC
  Administered 2015-04-19: 10 mg via RECTAL
  Filled 2015-04-19: qty 1

## 2015-04-19 NOTE — Care Management Note (Signed)
Case Management Note  Patient Details  Name: Andrew Ho MRN: [id number] Date of Birth: 05-31-1945  Subjective/Objective:     surgery              Action/Plan:  Plan to dc home with no needs   Expected Discharge Date:                  Expected Discharge Plan:  Home/Self Care  In-House Referral:     Discharge planning Services  CM Consult  Post Acute Care Choice:    Choice offered to:     DME Arranged:    DME Agency:     HH Arranged:    [place] Agency:     Status of Service:  Completed, signed off  Medicare Important Message Given:    Date Medicare IM Given:    Medicare IM give by:    Date Additional Medicare IM Given:    Additional Medicare Important Message give by:     If discussed at [place] of Stay Meetings, dates discussed:    Additional CommentsPurcell Mouton, RN 04/19/2015, 12:41 PM

## 2015-04-19 NOTE — Discharge Summary (Signed)
Date of admission: 04/18/2015  Date of discharge: 04/19/2015  Admission diagnosis: Prostate Cancer  Discharge diagnosis: Prostate Cancer  History and Physical: For full details, please see admission history and physical. Briefly, Andrew Ho is a 70 y.o. gentleman with localized prostate cancer.  After discussing management/treatment options, he elected to proceed with surgical treatment.  Hospital Course: Mylz D Massie was taken to the operating room on 04/18/2015 and underwent a robotic assisted laparoscopic radical prostatectomy. He tolerated this procedure well and without complications. Postoperatively, he was able to be transferred to a regular hospital room following recovery from anesthesia.  He was able to begin ambulating the night of surgery. He remained hemodynamically stable overnight.  He had excellent urine output with appropriately minimal output from his pelvic drain and his pelvic drain was removed on POD #1.  He was transitioned to oral pain medication, tolerated a clear liquid diet, and had met all discharge criteria and was able to be discharged home later on POD#1.  Laboratory values:  Recent Labs  04/18/15 1613 04/19/15 0453  HGB 13.2 12.5*  HCT 40.6 38.1*    Disposition: Home  Discharge instruction: He was instructed to be ambulatory but to refrain from heavy lifting, strenuous activity, or driving. He was instructed on urethral catheter care.  Discharge medications:     Medication List    STOP taking these medications        aspirin EC 81 MG tablet      TAKE these medications        allopurinol 300 MG tablet  Commonly known as:  ZYLOPRIM  300 MG BY MOUTH DAILY AFTER SUPPER     atenolol 50 MG tablet  Commonly known as:  TENORMIN  TAKE 50 MG BY MOUTH DAILY AT BEDTIME     atorvastatin 20 MG tablet  Commonly known as:  LIPITOR  TAKE 20 MG BY MOUTH DAILY AT BEDTIME     COLCRYS 0.6 MG tablet  Generic drug:  colchicine  TAKE 0.6 MG BY MOUTH DAILY AFTER  SUPPER     HYDROcodone-acetaminophen 5-325 MG tablet  Commonly known as:  NORCO  Take 1-2 tablets by mouth every 6 (six) hours as needed for moderate pain.     losartan 50 MG tablet  Commonly known as:  COZAAR  TAKE 50 mg BY MOUTH DAILY AT BEDTIME     metFORMIN 500 MG tablet  Commonly known as:  GLUCOPHAGE  500-1000 MG BY MOUTH DAILY WITH MEALS     sulfamethoxazole-trimethoprim 800-160 MG tablet  Commonly known as:  BACTRIM DS,SEPTRA DS  Take 1 tablet by mouth 2 (two) times daily. Start the day prior to foley removal appointment        Followup: He will followup in 1 week for catheter removal and to discuss his surgical pathology results.    

## 2015-04-19 NOTE — Progress Notes (Signed)
1 Day Post-Op Subjective: The patient is doing well.  No nausea or vomiting. Pain is adequately controlled.  Objective: Vital signs in last 24 hours: Temp:  [97.3 F (36.3 C)-99.7 F (37.6 C)] 98.2 F (36.8 C) (03/28 0414) Pulse Rate:  [64-92] 64 (03/28 0414) Resp:  [12-23] 16 (03/28 0414) BP: (107-162)/(51-86) 107/51 mmHg (03/28 0414) SpO2:  [93 %-100 %] 97 % (03/28 0414) Weight:  [105.688 kg (233 lb)] 105.688 kg (233 lb) (03/27 1714)  Intake/Output from previous day: 03/27 0701 - 03/28 0700 In: 3625 [I.V.:2625; IV Piggyback:1000] Out: 1185 [Urine:975; Drains:110; Blood:100] Intake/Output this shift: Total I/O In: -  Out: 50 [Drains:50]  Physical Exam:  General: Alert and oriented. CV: RRR Lungs: Clear bilaterally. GI: Soft, Nondistended. Incisions: Clean, dry, and intact Urine: Clear Extremities: Nontender, no erythema, no edema.  Lab Results:  Recent Labs  04/18/15 1613 04/19/15 0453  HGB 13.2 12.5*  HCT 40.6 38.1*      Assessment/Plan: POD# 1 s/p robotic prostatectomy.  1) SL IVF 2) Ambulate, Incentive spirometry 3) Transition to oral pain medication 4) Dulcolax suppository 5) D/C pelvic drain 6) Plan for likely discharge later today    LOS: 1 day   Andrew Ho 04/19/2015, 7:18 AM

## 2015-06-21 ENCOUNTER — Telehealth: Payer: Self-pay | Admitting: Medical Oncology

## 2015-06-21 ENCOUNTER — Encounter: Payer: Self-pay | Admitting: Medical Oncology

## 2015-06-21 NOTE — Telephone Encounter (Signed)
Oncology Nurse Navigator Documentation  Oncology Nurse Navigator Flowsheets 06/21/2015  Navigator Location [facility]  Navigator Encounter Type Introductory phone call- Left message requesting return call to discuss referral to [facility] [place]  Abnormal Finding Date 06/02/2015  Confirmed Diagnosis Date 02/16/2015  Surgery Date 04/18/2015  Acuity Level 1  Time Spent with Patient 15

## 2015-06-27 ENCOUNTER — Telehealth: Payer: Self-pay | Admitting: Medical Oncology

## 2015-06-27 NOTE — Telephone Encounter (Signed)
Oncology Nurse Navigator Documentation  Oncology Nurse Navigator Flowsheets 06/21/2015 06/27/2015  Navigator Location [facility] -  Navigator Encounter Type Introductory phone call Telephone  Telephone - Incoming Call;Appt Confirmation/Clarification  Abnormal Finding Date 06/02/2015 -  Confirmed Diagnosis Date 02/16/2015 -  Surgery Date 04/18/2015 -  Acuity Level 1 Level 1  Acuity Level 1 - Initial guidance, education and coordination as needed  Time Spent with Patient 15 15   I called pt to introduce myself as the Prostate Nurse Navigator and the Coordinator of the Prostate [place].  1. I confirmed with the patient he is aware of his referral to the clinic 07/01/15 arriving at 7:30am.  2. I discussed the format of the clinic and the physicians he will be seeing that day.  3. I discussed where the clinic is located and how to contact me.  4. He confirmed that he did receive my call last week and he did receive the  packet of information and forms to be completed. I asked him to bring them with him the day of his appointment.   He voiced understanding of the above. I asked him to call me if he has any questions or concerns regarding his appointments or the forms he needs to complete.

## 2015-06-29 ENCOUNTER — Encounter: Payer: Self-pay | Admitting: Radiation Oncology

## 2015-06-29 NOTE — Progress Notes (Signed)
GU Location of Tumor / Histology: prostate cancer s/p BNS RAL radical prostatectomy and BPLND on 04/18/2015  If Prostate Cancer, Gleason Score is (3 + 4) and PSA is (4.13) pre treatment but, 0.09 on 06/02/2015  Bess Harvest Mandley was referred from Dr. Junious Silk to Dr. Alinda Money in February 2017  1. Prostate, radical resection - PROSTATIC ADENOCARCINOMA, GLEASON SCORE 3 + 4 = 7 (WHO PROGNOSTIC GROUP II). - MARGINS NOT INVOLVED. - RIGHT AND LEFT SEMINAL VESICLES FOCALLY INVOLVED. - SEE ONCOLOGY TABLE. 2. 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 3. Lymph nodes, regional resection, left pelvic - FIVE BENIGN LYMPH NODES (0/5). Microscopic Comment 1. PROSTATE RADICAL RESECTION/PROSTATECTOMY Procedure: Prostatectomy. Prostate gland: Weight: 44 grams Dimension: 4.3 x 3.8 x 3.3 cm Histologic type: Prostatic adenocarcinoma Gleasons Score: 3(60%) + 4(40%) = 7 Tertiary score (if applicable): N/A Involving (half lobe or less, one or both lobes): Both lobes Estimated proportion (%) of prostate tissue involved by tumor: 8% Extraprostatic extension: No Involvement of apex: No Margins: Free of tumor, see comment Seminal vesicles: Right and left seminal vesicles focally involved, see comment. Lymph-Vascular invasion: No Treatment effect: No Lymph nodes: number examined 15; number positive 1  Past/Anticipated interventions by urology, if any: surgery, follow up, referral to [facility]  Past/Anticipated interventions by medical oncology, if any: no  Weight changes, if any: no  Bowel/Bladder complaints, if any: improving incontinence (wears two pads per day) and ED   Nausea/Vomiting, if any: no  Pain issues, if any:  no  SAFETY ISSUES:  Prior radiation? no  Pacemaker/ICD? no  Possible current pregnancy? no  Is the patient on methotrexate? 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 NKDA. Has one son and one daughter.

## 2015-06-30 ENCOUNTER — Telehealth: Payer: Self-pay | Admitting: Medical Oncology

## 2015-06-30 NOTE — Telephone Encounter (Signed)
Oncology Nurse Navigator Documentation  Oncology Nurse Navigator Flowsheets 06/21/2015 06/27/2015 06/30/2015  Navigator Location [facility] - -  Navigator Encounter Type Introductory phone call Telephone Telephone  Telephone - Incoming Call;Appt Confirmation/Clarification Outgoing Call;Appt Confirmation/Clarification- Mr. Ola confirmed his appointment for [facility] 07/01/15 arriving at 7:45am. I reminded him to bring his completed medical forms. He voiced understanding.  Abnormal Finding Date 06/02/2015 - -  Confirmed Diagnosis Date 02/16/2015 - -  Surgery Date 04/18/2015 - -  Barriers/Navigation Needs - - No barriers at this time  Support Groups/Services - - Friends and Family  Acuity Level 1 Level 1 -  Acuity Level 1 - Initial guidance, education and coordination as needed -  Time Spent with Patient 15 15 15

## 2015-07-01 ENCOUNTER — Ambulatory Visit (HOSPITAL_BASED_OUTPATIENT_CLINIC_OR_DEPARTMENT_OTHER): Payer: Medicare Other | Admitting: Oncology

## 2015-07-01 ENCOUNTER — Ambulatory Visit
Admission: RE | Admit: 2015-07-01 | Discharge: 2015-07-01 | Disposition: A | Discharge status: Home / Self Care | Payer: BC Managed Care – PPO | Source: Ambulatory Visit | Attending: Radiation Oncology | Admitting: Radiation Oncology

## 2015-07-01 ENCOUNTER — Encounter: Payer: Self-pay | Admitting: Radiation Oncology

## 2015-07-01 ENCOUNTER — Encounter: Payer: Self-pay | Admitting: General Practice

## 2015-07-01 ENCOUNTER — Encounter: Payer: Self-pay | Admitting: Medical Oncology

## 2015-07-01 VITALS — BP 149/73 | HR 57 | Resp 16 | Ht 68.0 in | Wt 227.6 lb

## 2015-07-01 DIAGNOSIS — E119 Type 2 diabetes mellitus without complications: Secondary | ICD-10-CM | POA: Diagnosis not present

## 2015-07-01 DIAGNOSIS — C775 Secondary and unspecified malignant neoplasm of intrapelvic lymph nodes: Secondary | ICD-10-CM | POA: Diagnosis not present

## 2015-07-01 DIAGNOSIS — M109 Gout, unspecified: Secondary | ICD-10-CM | POA: Diagnosis not present

## 2015-07-01 DIAGNOSIS — I10 Essential (primary) hypertension: Secondary | ICD-10-CM | POA: Insufficient documentation

## 2015-07-01 DIAGNOSIS — C61 Malignant neoplasm of prostate: Secondary | ICD-10-CM

## 2015-07-01 NOTE — Progress Notes (Signed)
                               Care Plan Summary  Name: Andrew Ho DOB: 01-16-1946   Your Medical Team:   Urologist -  Dr. Raynelle Bring, [facility]  Radiation Oncologist - Dr. Tyler Pita, [facility]   Medical Oncologist - Dr. Zola Button, [place]  Recommendations: 1) Androgen Deprivation Therapy- Hormone Therapy 2) Radiation Therapy   * These recommendations are based on information available as of today's consult.      Recommendations may change depending on the results of further tests or exams.   Next Steps: 1) Dr. Lynne Logan office will schedule hormone injection  2) CT simulation (planning session for radiation) 08/19/15 at 8:00am (planning session for radiation) 3) After CT simulation  Dr. Johny Shears office  will provide you with scheduling calendar for treatments.  When appointments need to be scheduled, you will be contacted by [facility] and/or [facility].  Questions?  Please do not hesitate to call Cira Rue, RN, BSN, OCN  at [phone] any questions or concerns.  Shirlean Mylar is your Oncology Nurse Navigator and is available to assist you while you're receiving your medical care at [facility].

## 2015-07-01 NOTE — Progress Notes (Signed)
[facility] Psychosocial Distress Screening Spiritual Care  Andrew Ho was seen by counseling intern Andrew Ho in [facility] to introduce [place] team/resources and review distress screen per protocol.  The patient scored a 0 on the Psychosocial Distress Thermometer which indicates mild distress. Andrew Ho also assessed for distress and other psychosocial needs.   ONCBCN DISTRESS SCREENING 07/01/2015  Screening Type Initial Screening  Distress experienced in past week (1-10) 0  Referral to support programs Yes  Other Support Team, counseling intern   Per pt, he feels positive and optimistic, reporting good support from wife and family.  He has a routine healthy diet and exercise plan, which helps with morale and positive outlook.  Follow up needed: No.  Andrew Ho is aware of ongoing [place] team/programming availability, but please also page if needs arise/circumstances change.  Thank you.  [place], [place], [facility] Pager [phone] Voicemail [phone]

## 2015-07-01 NOTE — Consult Note (Signed)
Office Visit Report     07/01/2015   --------------------------------------------------------------------------------   Andrew Ho. Andrew Ho  MRN: [id number]  PRIMARY CARE:  Lance Muss, MD  DOB: [birth year], 70 year old Male  REFERRING:    SSN: -[id number]  PROVIDER:  Raynelle Bring, M.D.    LOCATION:  [facility]  --------------------------------------------------------------------------------   CC/HPI: Prostate Cancer     Location of consult: [place]   Andrew Ho is 70 years old with prostate cancer s/p a BNS RAL radical prostatectomy and BPLND on 04/18/15.   Diagnosis: pT3b N1 Mx, Gleason 3+4=7 adenocarcinoma with negative surgical margins (1/15 lymph nodes positive)   Pretreatment PSA: 4.13  Pretreatment SHIM: 3   Interval history:   He follows up today for further discussion regarding his lymph node-positive prostate cancer.    ALLERGIES:  No Allergies    MEDICATIONS: No Medications    GU PSH: Laparoscopy; Lymphadenectomy - 04/19/2015 Robotic Radical Prostatectomy - 04/19/2015      PSH Notes: Laparoscopy With Bilateral Total Pelvic Lymphadenectomy, Prostatect Retropubic Radical W/ Nerve Sparing Laparoscopic, No Surgical Problems   NON-GU PSH: No Non-GU PSH    GU PMH: ED, arterial insufficiency, Erectile dysfunction due to arterial insufficiency - 06/01/2015 Prostate Cancer, Prostate cancer - 06/01/2015 Stress Incontinence, M/F, Male stress incontinence - 06/01/2015 Mixed incontinence, Mixed incontinence urge and stress (male)(male) - 05/30/2015 Nocturia, Nocturia - 08/02/2014 Other microscopic hematuria, Microscopic hematuria - 05-20-12    NON-GU PMH: Muscle weakness (generalized), Muscle weakness - 05/30/2015 Other lack of coordination, Abnormal coordination - 05/30/2015 Separation of muscle (nontraumatic), unspecified site, Diastasis of muscle - 05/30/2015 Personal history of other diseases of the musculoskeletal system and connective tissue, History of  gout - 03/13/2015 Encounter for general adult medical examination without abnormal findings, Encounter for preventive health examination - 03/02/2015 Personal history of other diseases of the circulatory system, History of hypertension - May 20, 2012 Personal history of other endocrine, nutritional and metabolic disease, History of hypercholesterolemia - May 20, 2012    FAMILY HISTORY: Death In The Family Father - Father Family Health Status Number Of Children - Runs In Family   SOCIAL HISTORY: No Social History     Notes: Activities of daily living (ADL's), independent, Exercise habits, Never A Smoker, Occupation:, Tobacco Use, Marital History - Currently Married, Alcohol Use, Death In The Family Father   REVIEW OF SYSTEMS:    GU Review Male:   Patient denies frequent urination, hard to postpone urination, burning/ pain with urination, get up at night to urinate, leakage of urine, stream starts and stops, trouble starting your streams, and have to strain to urinate .  Gastrointestinal (Upper):   Patient denies nausea and vomiting.  Gastrointestinal (Lower):   Patient denies diarrhea and constipation.  Constitutional:   Patient denies fever, night sweats, weight loss, and fatigue.  Skin:   Patient denies skin rash/ lesion and itching.  Eyes:   Patient denies blurred vision and double vision.  Ears/ Nose/ Throat:   Patient denies sore throat and sinus problems.  Hematologic/Lymphatic:   Patient denies swollen glands and easy bruising.  Cardiovascular:   Patient denies leg swelling and chest pains.  Respiratory:   Patient denies cough and shortness of breath.  Endocrine:   Patient denies excessive thirst.  Musculoskeletal:   Patient denies back pain and joint pain.  Neurological:   Patient denies headaches and dizziness.  Psychologic:   Patient denies depression and anxiety.   VITAL SIGNS: None   MULTI-SYSTEM PHYSICAL EXAMINATION:  Constitutional: Well-nourished. No physical deformities. Normally  developed. Good grooming.     PAST DATA REVIEWED:  Source Of History:  Patient  Lab Test Review:   PSA  Notes:                     His PSA from yesterday was less than 0.09.   PROCEDURES:          PSA [id number] PSA drawn.   ASSESSMENT:      ICD-10 Details  1 GU:   Prostate Cancer - C61    PLAN:           Document Letter(s):  Created for Patient: Clinical Summary         Notes:   1. Lymph node-positive prostate cancer: I had a detailed discussion with Mr. Killey and his wife today. He has seen Dr. Tammi Klippel today and discussed the option of adjuvant radiation therapy in this setting. He understands that we would proceed with combination androgen deprivation therapy for at least 6 months and possibly up to 2 years if he tolerated therapy fairly well. He is interested in this approach and will be scheduled to begin Lupron 45 mg next week. He will plan to begin radiation therapy in August and has an appointment to proceed with planning in late July. He will then be scheduled see me in approximately 6 months to discuss continuing androgen deprivation and to check his next PSA. All questions were answered to his stated satisfaction.   2. Incontinence: He will continue pelvic floor muscle training. He understands the importance of resolving his incontinence prior to starting radiation therapy and will notify me if he does not have his incontinence resolved as he approaches the day for radiation.   3. Erectile dysfunction: He understands the effect of androgen deprivation we'll have on his libido. He does have pre-existing erectile dysfunction.   Cc: Dr. Eda Keys  Dr. Zada Girt  Dr. Ledon Snare  Dr. Zola Button    E & M CODE: I spent at least 25 minutes face to face with the patient, more than 50% of that time was spent on counseling and/or coordinating care.     * Signed by Raynelle Bring, M.D. on 07/01/15 at 2:58 PM (EDT)*

## 2015-07-01 NOTE — Progress Notes (Signed)
Reason for Referral: Prostate cancer.   HPI: 70 year old gentleman, to [place] where he has been living majority of his life. He is a gentleman with history of hypertension, hyperlipidemia and gout and was found to have prostate cancer in January 2017. At that time his PSA have risen up to 4.13 and a biopsy confirmed the presence of a Gleason score 4+3 = 7 in 2 cores and a 3+3 = 6 in 2 other cores. He subsequently underwent radical prostatectomy in 04/18/2015. The final pathology revealed prostate adenocarcinoma with a Gleason score 3+4 = 7. Margins were not involved but the right and left seminal vesicles were focally involved. One out of 10 lymph nodes were noted in the right pelvis. 0 out of 5 lymph nodes were noted in the left pelvis. Since his operation, he have recovered fully well and have regained all activities of daily living. His continence has been restored and his performance status and activity level back to baseline.  He does not report any headaches, blurry vision, syncope or seizures. He does not report any fevers or chills or sweats. Does not report any cough, wheezing or hemoptysis. Does not report any nausea, vomiting or abdominal pain. Does not report any frequency hematuria or dysuria. Does not report any skeletal complaints of arthralgias or myalgias. Remaining review of systems unremarkable.   Past Medical History  Diagnosis Date  . Hypertension   . History of gout   . Discoloration of skin since 08/2014    rt lower leg   . Prostate cancer ([place])   . Diabetes mellitus without complication ([place])   :  Past Surgical History  Procedure Laterality Date  . Cataract extraction    . Robot assisted laparoscopic radical prostatectomy N/A 04/18/2015    Procedure: XI ROBOTIC ASSISTED LAPAROSCOPIC RADICAL PROSTATECTOMY LEVEL 2;  Surgeon: Raynelle Bring, MD;  Location: [facility];  Service: Urology;  Laterality: N/A;  . Lymphadenectomy Bilateral 04/18/2015    Procedure: PELVIC  LYMPHADENECTOMY;  Surgeon: Raynelle Bring, MD;  Location: [facility];  Service: Urology;  Laterality: Bilateral;  :   Current outpatient prescriptions:  .  allopurinol (ZYLOPRIM) 300 MG tablet, 300 MG BY MOUTH DAILY AFTER SUPPER, Disp: , Rfl: 0 .  aspirin 81 MG tablet, Take 81 mg by mouth daily., Disp: , Rfl:  .  atenolol (TENORMIN) 50 MG tablet, TAKE 50 MG BY MOUTH DAILY AT BEDTIME, Disp: , Rfl: 0 .  atorvastatin (LIPITOR) 20 MG tablet, TAKE 20 MG BY MOUTH DAILY AT BEDTIME, Disp: , Rfl: 0 .  COLCRYS 0.6 MG tablet, TAKE 0.6 MG BY MOUTH DAILY AFTER SUPPER, Disp: , Rfl: 0 .  losartan (COZAAR) 50 MG tablet, TAKE 50 mg BY MOUTH DAILY AT BEDTIME, Disp: , Rfl: 0 .  metFORMIN (GLUCOPHAGE) 500 MG tablet, [phone] MG BY MOUTH DAILY WITH MEALS, Disp: , Rfl: 0:  No Known Allergies:  Family History  Problem Relation Age of Onset  . Dementia Father   . Heart failure Father   :  Social History   Social History  . Marital Status: Married    Spouse Name: N/A  . Number of Children: N/A  . Years of Education: N/A   Occupational History  . Not on file.   Social History Main Topics  . Smoking status: Never Smoker   . Smokeless tobacco: Never Used  . Alcohol Use: No  . Drug Use: No  . Sexual Activity: Not Currently   Other Topics Concern  . Not on file  Social History Narrative  :  Pertinent items are noted in HPI.  Exam: ECOG 0 General appearance: alert and cooperative Head: Normocephalic, without obvious abnormality Throat: lips, mucosa, and tongue normal; teeth and gums normal Neck: no adenopathy Back: negative Resp: clear to auscultation bilaterally Chest wall: no tenderness Cardio: regular rate and rhythm, S1, S2 normal, no murmur, click, rub or gallop GI: soft, non-tender; bowel sounds normal; no masses,  no organomegaly Extremities: extremities normal, atraumatic, no cyanosis or edema Pulses: 2+ and symmetric Skin: Skin color, texture, turgor normal. No rashes or  lesions Lymph nodes: Cervical, supraclavicular, and axillary nodes normal.    Assessment and Plan:   70 year old gentleman with prostate cancer initially diagnosed in January 2017 with a Gleason score of 3+4 = 7 and a PSA of around 4. He is status post radical prostatectomy in March 2017 and the final pathology revealed a T3b N1 disease with 1 out of 10 lymph nodes noted in the right pelvis and 0 out of 5 lymph nodes in the left pelvis. He does have a positive right and left seminal vesicle involvement. His most recent postoperative PSA was 0.09.  His case was discussed today and the prostate cancer multidisciplinary clinic. His imaging studies as well as pathology were reviewed. It appears he would be a good candidate for adjuvant radiation therapy concomitantly with a short course of androgen deprivation. Given his high risk of local recurrence especially with seminal vesicle involvement as well as positive lymph node involvement he would be at high risk as well for systemic recurrence.  The rationale for using androgen deprivation therapy were reviewed. Complications associated without including hot flashes, weight gain as well as erectile dysfunction. I anticipate duration of therapy would be no less than 6 months preferably close to 2 years if he can tolerate it. He is open to this therapy and willing to proceed with any modality that would help improve his chances of disease control long-term.

## 2015-07-01 NOTE — Progress Notes (Signed)
Radiation Oncology         [phone] ________________________________  Initial outpatient Consultation  Name: Andrew Ho MRN: [id number]  Date: 07/01/2015  DOB: March 29, 1945     [place]:[id number], Keenan Bachelor, MD  Raynelle Bring, MD   REFERRING PHYSICIAN: Raynelle Bring, MD  DIAGNOSIS: 70 y.o. gentleman with stage pT3bN1 post-prostatetectomy adenocarcinoma of the prostate with a Gleason's score of 3+4 and a PSA of 0.09 on 06/02/2015    ICD-9-CM ICD-10-CM   1. Prostate cancer ([place]) [place] is a 70 y.o. gentleman.  He was noted to have an elevated PSA of 4.13 by his primary care physician, Dr. Nyoka Cowden.  Accordingly, he was referred for evaluation in urology by Dr. Alinda Money. The patient proceeded to transrectal ultrasound with 12 biopsies of the prostate on 02/16/15.   Out of 12 core biopsies, 4 were positive.  The maximum Gleason's score was 3 + 4 = 7. Patient decided on prostatectomy this was performed on 04/18/15. Surgical pathology indicated Gleason's 3 + 4  = 7 with focal bilateral seminal vesicle involvement.  Out of 15 lymph nodes, one lymph node was minimally involved from the right pelvis. Post- surgery he was found to have a detectable PSA of 0.09, an additional PSA level study is pending.   The patient reviewed the biopsy results with his urologist and he has kindly been referred today for discussion of potential radiation treatment options.  The patient has not had any recent weight changes. Denies nausea or pain. He has some incontinence that is improving with time ( wears two pads/ day). He also reports erectile dysfunction.   Patient is married. Engineer, structural. Stays physically active. NKDA. Has one son and one daughter.   PREVIOUS RADIATION THERAPY: No  PAST MEDICAL HISTORY:  has a past medical history of Hypertension; History of gout; Discoloration of skin (since 08/2014); Prostate cancer ([place]); and Diabetes mellitus without complication ([place]).     PAST SURGICAL HISTORY: Past Surgical History  Procedure Laterality Date  . Cataract extraction    . Robot assisted laparoscopic radical prostatectomy N/A 04/18/2015    Procedure: XI ROBOTIC ASSISTED LAPAROSCOPIC RADICAL PROSTATECTOMY LEVEL 2;  Surgeon: Raynelle Bring, MD;  Location: [facility];  Service: Urology;  Laterality: N/A;  . Lymphadenectomy Bilateral 04/18/2015    Procedure: PELVIC LYMPHADENECTOMY;  Surgeon: Raynelle Bring, MD;  Location: [facility];  Service: Urology;  Laterality: Bilateral;    FAMILY HISTORY: family history includes Dementia in his father; Heart failure in his father.  SOCIAL HISTORY:  reports that he has never smoked. He has never used smokeless tobacco. He reports that he does not drink alcohol or use illicit drugs.  ALLERGIES: Review of patient's allergies indicates no known allergies.  MEDICATIONS:  Current Outpatient Prescriptions  Medication Sig Dispense Refill  . allopurinol (ZYLOPRIM) 300 MG tablet 300 MG BY MOUTH DAILY AFTER SUPPER  0  . aspirin 81 MG tablet Take 81 mg by mouth daily.    Marland Kitchen atenolol (TENORMIN) 50 MG tablet TAKE 50 MG BY MOUTH DAILY AT BEDTIME  0  . atorvastatin (LIPITOR) 20 MG tablet TAKE 20 MG BY MOUTH DAILY AT BEDTIME  0  . COLCRYS 0.6 MG tablet TAKE 0.6 MG BY MOUTH DAILY AFTER SUPPER  0  . losartan (COZAAR) 50 MG tablet TAKE 50 mg BY MOUTH DAILY AT BEDTIME  0  . metFORMIN (GLUCOPHAGE) 500 MG tablet [phone] MG BY MOUTH DAILY WITH MEALS  0  No current facility-administered medications for this encounter.    REVIEW OF SYSTEMS:  A 15 point review of systems is documented in the electronic medical record. This was obtained by the nursing staff. However, I reviewed this with the patient to discuss relevant findings and make appropriate changes.  Pertinent items are noted in HPI..  The patient completed an IPSS and IIEF questionnaire.  His IPSS score was 3 indicating mild urinary outflow obstructive symptoms.  He indicated that his erectile  function is a low priority for him and his wife.   PHYSICAL EXAM: This patient is in no acute distress.  He is alert and oriented.   height is 5\' 8"  (1.727 m) and weight is 227 lb 9.6 oz (103.239 kg). His blood pressure is 149/73 and his pulse is 57. His respiration is 16 and oxygen saturation is 100%.  He exhibits no respiratory distress or labored breathing.  He appears neurologically intact.  His mood is pleasant.  His affect is appropriate.  Please note the digital rectal exam findings described above.  KPS = 90  100 - Normal; no complaints; no evidence of disease. 90   - Able to carry on normal activity; minor signs or symptoms of disease. 80   - Normal activity with effort; some signs or symptoms of disease. 46   - Cares for self; unable to carry on normal activity or to do active work. 60   - Requires occasional assistance, but is able to care for most of his personal needs. 50   - Requires considerable assistance and frequent medical care. 25   - Disabled; requires special care and assistance. 60   - Severely disabled; hospital admission is indicated although death not imminent. 9   - Very sick; hospital admission necessary; active supportive treatment necessary. 10   - Moribund; fatal processes progressing rapidly. 0     - Dead  Karnofsky DA, Abelmann Dillingham, Craver LS and Burchenal [facility] [phone]) The use of the nitrogen mustards in the palliative treatment of carcinoma: with particular reference to bronchogenic carcinoma Cancer 1 [phone]   LABORATORY DATA:  Lab Results  Component Value Date   WBC 8.3 04/11/2015   HGB 12.5* 04/19/2015   HCT 38.1* 04/19/2015   MCV 85.3 04/11/2015   PLT 225 04/11/2015   Lab Results  Component Value Date   NA 139 04/11/2015   K 4.6 04/11/2015   CL 105 04/11/2015   CO2 24 04/11/2015   No results found for: ALT, AST, GGT, ALKPHOS, BILITOT   RADIOGRAPHY: No results found.    IMPRESSION: This gentleman is a 70 year old diagnosed with prostate s/p  prostatectomy.  His T-Stage, Gleason's Score, and PSA put him into the high risk group.  Accordingly he is eligible for a variety of potential treatment options including external beam radiation with ADT. We discussed proceeding with radiation treatment in early August to allow for proper healing from his surgery and recover urinary function.  PLAN:Today I reviewed the findings and workup thus far.  We discussed the natural history of prostate cancer.  We reviewed the the implications of T-stage, Gleason's Score, and PSA on decision-making and outcomes in prostate cancer.  We discussed radiation treatment in the management of prostate cancer with regard to the logistics and delivery of external beam radiation treatment.   The patient would like to proceed with prostate IMRT.  I will share my findings with Dr. Alinda Money and move forward with scheduling simulation to proceed with IMRT 4  months post-op.     I enjoyed meeting with him today, and will look forward to participating in the care of this very nice gentleman.   I spent 40 minutes face to face with the patient and more than 50% of that time was spent in counseling and/or coordination of care.   ------------------------------------------------  Sheral Apley. Tammi Klippel, M.D.  This document serves as a record of services personally performed by Tyler Pita, MD. It was created on his behalf by Derek Mound, a trained medical scribe. The creation of this record is based on the scribe's personal observations and the provider's statements to them. This document has been checked and approved by the attending provider.

## 2015-08-18 NOTE — Progress Notes (Signed)
  Radiation Oncology         [phone] ________________________________  Name: Andrew Ho MRN: [id number]  Date: 08/19/2015  DOB: [birth year]  SIMULATION AND TREATMENT PLANNING NOTE    ICD-9-CM ICD-10-CM   1. 70 yo man with detectable PSA of 0.09 s/p prostatectomy with focally positive seminal vesicles and 1 out 15 nodes positive with Gleason 3+4- Stage pT3b N1 185 C61    NARRATIVE:  The patient was brought to the [facility].  Identity was confirmed.  All relevant records and images related to the planned course of therapy were reviewed.  The patient freely provided informed written consent to proceed with treatment after reviewing the details related to the planned course of therapy. The consent form was witnessed and verified by the simulation staff.  Then, the patient was set-up in a stable reproducible supine position for radiation therapy.  A vacuum lock pillow device was custom fabricated to position his legs in a reproducible immobilized position.  Then, I performed a urethrogram under sterile conditions to identify the prostatic apex.  CT images were obtained.  Surface markings were placed.  The CT images were loaded into the planning software.  Then the prostate target and avoidance structures including the rectum, bladder, bowel and hips were contoured.  Treatment planning then occurred.  The radiation prescription was entered and confirmed.  A total of one complex treatment device was fabricated. I have requested : Intensity Modulated Radiotherapy (IMRT) is medically necessary for this case for the following reason:  Rectal sparing.Marland Kitchen  PLAN:  The patient will receive 45 Gy in 25 fractions to the pelvis, then 68.4 Gy total to the prostatic fossa.  ________________________________  Sheral Apley Tammi Klippel, M.D.  This document serves as a record of services personally performed by Tyler Pita, MD. It was created on his behalf by Truddie Hidden, a trained medical scribe.  The creation of this record is based on the scribe's personal observations and the provider's statements to them. This document has been checked and approved by the attending provider.

## 2015-08-19 ENCOUNTER — Ambulatory Visit
Admission: RE | Admit: 2015-08-19 | Discharge: 2015-08-19 | Disposition: A | Discharge status: Home / Self Care | Payer: BC Managed Care – PPO | Source: Ambulatory Visit | Attending: Radiation Oncology | Admitting: Radiation Oncology

## 2015-08-19 ENCOUNTER — Encounter: Payer: Self-pay | Admitting: Medical Oncology

## 2015-08-19 DIAGNOSIS — Z510 Encounter for antineoplastic radiation therapy: Secondary | ICD-10-CM | POA: Insufficient documentation

## 2015-08-19 DIAGNOSIS — Z7982 Long term (current) use of aspirin: Secondary | ICD-10-CM | POA: Diagnosis not present

## 2015-08-19 DIAGNOSIS — C61 Malignant neoplasm of prostate: Secondary | ICD-10-CM

## 2015-08-19 DIAGNOSIS — Z79899 Other long term (current) drug therapy: Secondary | ICD-10-CM | POA: Diagnosis not present

## 2015-08-19 NOTE — Progress Notes (Signed)
Oncology Nurse Navigator Documentation  Oncology Nurse Navigator Flowsheets 06/30/2015 07/01/2015 08/19/2015  Navigator Location - - -  Navigator Encounter Type Telephone Clinic/MDC -  Telephone Outgoing Call;Appt Confirmation/Clarification - -  Abnormal Finding Date - - -  Confirmed Diagnosis Date - - -  Surgery Date - - -  Patient Visit Type - - RadOnc  Treatment Phase - - CT SIM-Mr. Coviello states that Dr. Alinda Money had discussed Androgen Deprivation for 6 months with the radiation therapy. He has not received a call regarding the injection so he is not sure if he still needs. Per Dr. Tammi Klippel he does need. Dr. Lynne Logan office contacted and he will received his ADT 08/23/15. Tolerated the CT simulation without any problems and all questions and concerns were addressed. He will begin treatments 08/30/15.  Barriers/Navigation Needs No barriers at this time Education Education  Education - Geneticist, molecular for Upcoming Surgery/ Treatment  Interventions - Education Method Education Method  Education Method - Teach-back;Verbal;Written Teach-back;Verbal;Written  Support Groups/Services Friends and Family Friends and Family -  Acuity - - -  Acuity Level 1 - - -  Time Spent with Patient 15 > 120 45

## 2015-08-23 ENCOUNTER — Encounter: Payer: Self-pay | Admitting: Medical Oncology

## 2015-08-26 DIAGNOSIS — Z510 Encounter for antineoplastic radiation therapy: Secondary | ICD-10-CM | POA: Diagnosis not present

## 2015-08-30 ENCOUNTER — Encounter: Payer: Self-pay | Admitting: Medical Oncology

## 2015-08-30 ENCOUNTER — Ambulatory Visit
Admission: RE | Admit: 2015-08-30 | Discharge: 2015-08-30 | Disposition: A | Discharge status: Home / Self Care | Payer: BC Managed Care – PPO | Source: Ambulatory Visit | Attending: Radiation Oncology | Admitting: Radiation Oncology

## 2015-08-30 DIAGNOSIS — Z510 Encounter for antineoplastic radiation therapy: Secondary | ICD-10-CM | POA: Diagnosis not present

## 2015-08-31 ENCOUNTER — Ambulatory Visit
Admission: RE | Admit: 2015-08-31 | Discharge: 2015-08-31 | Disposition: A | Discharge status: Home / Self Care | Payer: BC Managed Care – PPO | Source: Ambulatory Visit | Attending: Radiation Oncology | Admitting: Radiation Oncology

## 2015-08-31 DIAGNOSIS — Z510 Encounter for antineoplastic radiation therapy: Secondary | ICD-10-CM | POA: Diagnosis not present

## 2015-08-31 NOTE — Progress Notes (Signed)
Oncology Nurse Navigator Documentation  Oncology Nurse Navigator Flowsheets 08/19/2015 08/22/2015 08/30/2015  Navigator Location - - -  Navigator Encounter Type - - -  Telephone - - -  Abnormal Finding Date - - -  Confirmed Diagnosis Date - - -  Surgery Date - - -  Patient Visit Type RadOnc - RadOnc  Treatment Phase CT SIM Treatment First Radiation Tx  Barriers/Navigation Needs Education - Education- Discussed the importance of having a comfortable full bladder for treatment. Informed patient that he will see Dr. Emmaline Kluver weekly to monitor side effects of radiation. He voiced understanding.  Education Preparing for Biomedical scientist Treatment - Pain/ Symptom Management  Interventions Education Method - Education Method  Education Method Teach-back;Verbal;Written - Teach-back;Verbal  Support Groups/Services - - Friends and Family  Acuity - - -  Acuity Level 1 - - -  Time Spent with Patient 15 - 30

## 2015-09-01 ENCOUNTER — Ambulatory Visit
Admission: RE | Admit: 2015-09-01 | Discharge: 2015-09-01 | Disposition: A | Discharge status: Home / Self Care | Payer: BC Managed Care – PPO | Source: Ambulatory Visit | Attending: Radiation Oncology | Admitting: Radiation Oncology

## 2015-09-01 DIAGNOSIS — Z510 Encounter for antineoplastic radiation therapy: Secondary | ICD-10-CM | POA: Diagnosis not present

## 2015-09-02 ENCOUNTER — Ambulatory Visit
Admission: RE | Admit: 2015-09-02 | Discharge: 2015-09-02 | Disposition: A | Discharge status: Home / Self Care | Payer: BC Managed Care – PPO | Source: Ambulatory Visit | Attending: Radiation Oncology | Admitting: Radiation Oncology

## 2015-09-02 VITALS — BP 168/81 | HR 54 | Resp 18 | Wt 232.2 lb

## 2015-09-02 DIAGNOSIS — C61 Malignant neoplasm of prostate: Secondary | ICD-10-CM

## 2015-09-02 DIAGNOSIS — Z510 Encounter for antineoplastic radiation therapy: Secondary | ICD-10-CM | POA: Diagnosis not present

## 2015-09-02 NOTE — Progress Notes (Signed)
   Department of Radiation Oncology  Phone:  [phone] Fax:        [phone]  Weekly Treatment Note    Name: Andrew Ho Date: 09/04/2015 MRN: [id number] DOB: 03/04/1945   Diagnosis:     ICD-9-CM ICD-10-CM   12. 70 yo man with detectable PSA of 0.09 s/p prostatectomy with focally positive seminal vesicles and 1 out 15 nodes positive with Gleason 3+4- Stage pT3b N1 185 C61      Current dose: 7.2 Gy  Current fraction: 4   MEDICATIONS: Current Outpatient Prescriptions  Medication Sig Dispense Refill  . allopurinol (ZYLOPRIM) 300 MG tablet 300 MG BY MOUTH DAILY AFTER SUPPER  0  . aspirin 81 MG tablet Take 81 mg by mouth daily.    Marland Kitchen atenolol (TENORMIN) 50 MG tablet TAKE 50 MG BY MOUTH DAILY AT BEDTIME  0  . atorvastatin (LIPITOR) 20 MG tablet TAKE 20 MG BY MOUTH DAILY AT BEDTIME  0  . COLCRYS 0.6 MG tablet TAKE 0.6 MG BY MOUTH DAILY AFTER SUPPER  0  . losartan (COZAAR) 50 MG tablet TAKE 50 mg BY MOUTH DAILY AT BEDTIME  0  . metFORMIN (GLUCOPHAGE) 500 MG tablet [phone] MG BY MOUTH DAILY WITH MEALS  0   No current facility-administered medications for this encounter.      ALLERGIES: Review of patient's allergies indicates no known allergies.   LABORATORY DATA:  Lab Results  Component Value Date   WBC 8.3 04/11/2015   HGB 12.5 (L) 04/19/2015   HCT 38.1 (L) 04/19/2015   MCV 85.3 04/11/2015   PLT 225 04/11/2015   Lab Results  Component Value Date   NA 139 04/11/2015   K 4.6 04/11/2015   CL 105 04/11/2015   CO2 24 04/11/2015   No results found for: ALT, AST, GGT, ALKPHOS, BILITOT   NARRATIVE: Andrew Ho was seen today for weekly treatment management. The chart was checked and the patient's films were reviewed.  Weight and vitals stable. Denies pain. Reports nocturia x 1. Denies dysuria or hematuria. Describes a steady urine stream. Denies urinary frequency or difficulty emptying his bladder. Denies incontinence. Reports occasional leakage when he laughs or  works out. Denies diarrhea. Denies fatigue.   PHYSICAL EXAMINATION: weight is 232 lb 3.2 oz (105.3 kg). His blood pressure is 168/81 (abnormal) and his pulse is 54 (abnormal). His respiration is 18 and oxygen saturation is 100%.      Alert, in no acute distress.  ASSESSMENT: The patient is doing satisfactorily with treatment.  PLAN: We will continue with the patient's radiation treatment as planned.     This document serves as a record of services personally performed by Kyung Rudd, MD. It was created on his behalf by Arlyce Harman, a trained medical scribe. The creation of this record is based on the scribe's personal observations and the provider's statements to them. This document has been checked and approved by the attending provider.  ------------------------------------------------  Jodelle Gross, MD, PhD

## 2015-09-02 NOTE — Progress Notes (Addendum)
Weight and vitals stable. Denies pain. Reports nocturia x 1. Denies dysuria or hematuria. Describes a steady urine stream. Denies urinary frequency or difficulty emptying his bladder. Denies incontinence. Reports occasional leakage when he laughs or works out. Denies diarrhea. Denies fatigue.   BP (!) 168/81 (BP Location: Left Arm, Patient Position: Sitting, Cuff Size: Large)   Pulse (!) 54   Resp 18   Wt 232 lb 3.2 oz (105.3 kg)   SpO2 100%   BMI 35.31 kg/m  Wt Readings from Last 3 Encounters:  09/02/15 232 lb 3.2 oz (105.3 kg)  07/01/15 227 lb 9.6 oz (103.2 kg)  04/18/15 233 lb (105.7 kg)

## 2015-09-05 ENCOUNTER — Ambulatory Visit
Admission: RE | Admit: 2015-09-05 | Discharge: 2015-09-05 | Disposition: A | Discharge status: Home / Self Care | Payer: BC Managed Care – PPO | Source: Ambulatory Visit | Attending: Radiation Oncology | Admitting: Radiation Oncology

## 2015-09-05 DIAGNOSIS — Z510 Encounter for antineoplastic radiation therapy: Secondary | ICD-10-CM | POA: Diagnosis not present

## 2015-09-06 ENCOUNTER — Ambulatory Visit
Admission: RE | Admit: 2015-09-06 | Discharge: 2015-09-06 | Disposition: A | Discharge status: Home / Self Care | Payer: BC Managed Care – PPO | Source: Ambulatory Visit | Attending: Radiation Oncology | Admitting: Radiation Oncology

## 2015-09-06 DIAGNOSIS — Z510 Encounter for antineoplastic radiation therapy: Secondary | ICD-10-CM | POA: Diagnosis not present

## 2015-09-07 ENCOUNTER — Ambulatory Visit
Admission: RE | Admit: 2015-09-07 | Discharge: 2015-09-07 | Disposition: A | Discharge status: Home / Self Care | Payer: BC Managed Care – PPO | Source: Ambulatory Visit | Attending: Radiation Oncology | Admitting: Radiation Oncology

## 2015-09-07 DIAGNOSIS — Z510 Encounter for antineoplastic radiation therapy: Secondary | ICD-10-CM | POA: Diagnosis not present

## 2015-09-07 NOTE — Addendum Note (Signed)
Encounter addended by: Heywood Footman, RN on: 09/07/2015  8:53 AM<BR>    Actions taken: Chief Complaint modified, Patient Education assessment filed

## 2015-09-08 ENCOUNTER — Ambulatory Visit
Admission: RE | Admit: 2015-09-08 | Discharge: 2015-09-08 | Disposition: A | Discharge status: Home / Self Care | Payer: BC Managed Care – PPO | Source: Ambulatory Visit | Attending: Radiation Oncology | Admitting: Radiation Oncology

## 2015-09-08 VITALS — BP 176/85 | HR 57 | Resp 16 | Wt 224.8 lb

## 2015-09-08 DIAGNOSIS — C61 Malignant neoplasm of prostate: Secondary | ICD-10-CM

## 2015-09-08 DIAGNOSIS — Z510 Encounter for antineoplastic radiation therapy: Secondary | ICD-10-CM | POA: Diagnosis not present

## 2015-09-08 NOTE — Progress Notes (Signed)
Vitals stable. Weight loss noted. Denies pain. Reports nocturia x 1. Denies dysuria or hematuria. Describes a steady urine stream. Denies urinary frequency or difficulty emptying his bladder. Denies incontinence. Reports occasional leakage when he laughs or works out. Denies diarrhea. Denies fatigue.  BP (!) 176/85   Pulse (!) 57   Resp 16   Wt 224 lb 12.8 oz (102 kg)   SpO2 100%   BMI 34.18 kg/m  Wt Readings from Last 3 Encounters:  09/08/15 224 lb 12.8 oz (102 kg)  09/02/15 232 lb 3.2 oz (105.3 kg)  07/01/15 227 lb 9.6 oz (103.2 kg)

## 2015-09-08 NOTE — Progress Notes (Signed)
  Radiation Oncology         [phone]   Name: Andrew Ho MRN: [id number]   Date: 09/08/2015  DOB: 03-Jul-1945     Weekly Radiation Therapy Management    ICD-9-CM ICD-10-CM   1. 70 yo man with detectable PSA of 0.09 s/p prostatectomy with focally positive seminal vesicles and 1 out 15 nodes positive with Gleason 3+4- Stage pT3b N1 185 C61     Current Dose: 14.4 Gy  Planned Dose:  45 Gy  Narrative The patient presents for routine under treatment assessment.  Vitals stable. Weight loss noted. Denies pain. Reports nocturia x1. Denies dysuria or hematuria. Describes a steady urine stream. Denies urinary frequency or difficulty emptying his bladder. Denies incontinence. Reports occasional leakage when he laughs, sneezes, or works out. Denies diarrhea. Denies fatigue.   The patient is without complaint. Set-up films were reviewed. The chart was checked.  Physical Findings  weight is 224 lb 12.8 oz (102 kg). His blood pressure is 176/85 (abnormal) and his pulse is 57 (abnormal). His respiration is 16 and oxygen saturation is 100%. . Weight essentially stable.  No significant changes.  Impression The patient is tolerating radiation.  Plan Continue treatment as planned.         Sheral Apley Tammi Klippel, M.D.  This document serves as a record of services personally performed by Tyler Pita, MD. It was created on his behalf by Arlyce Harman, a trained medical scribe. The creation of this record is based on the scribe's personal observations and the provider's statements to them. This document has been checked and approved by the attending provider.

## 2015-09-09 ENCOUNTER — Encounter: Payer: Self-pay | Admitting: Medical Oncology

## 2015-09-09 ENCOUNTER — Ambulatory Visit
Admission: RE | Admit: 2015-09-09 | Discharge: 2015-09-09 | Disposition: A | Discharge status: Home / Self Care | Payer: BC Managed Care – PPO | Source: Ambulatory Visit | Attending: Radiation Oncology | Admitting: Radiation Oncology

## 2015-09-09 DIAGNOSIS — Z510 Encounter for antineoplastic radiation therapy: Secondary | ICD-10-CM | POA: Diagnosis not present

## 2015-09-12 ENCOUNTER — Ambulatory Visit
Admission: RE | Admit: 2015-09-12 | Discharge: 2015-09-12 | Disposition: A | Discharge status: Home / Self Care | Payer: BC Managed Care – PPO | Source: Ambulatory Visit | Attending: Radiation Oncology | Admitting: Radiation Oncology

## 2015-09-12 DIAGNOSIS — Z510 Encounter for antineoplastic radiation therapy: Secondary | ICD-10-CM | POA: Diagnosis not present

## 2015-09-13 ENCOUNTER — Ambulatory Visit
Admission: RE | Admit: 2015-09-13 | Discharge: 2015-09-13 | Disposition: A | Discharge status: Home / Self Care | Payer: BC Managed Care – PPO | Source: Ambulatory Visit | Attending: Radiation Oncology | Admitting: Radiation Oncology

## 2015-09-13 DIAGNOSIS — Z510 Encounter for antineoplastic radiation therapy: Secondary | ICD-10-CM | POA: Diagnosis not present

## 2015-09-14 ENCOUNTER — Ambulatory Visit
Admission: RE | Admit: 2015-09-14 | Discharge: 2015-09-14 | Disposition: A | Discharge status: Home / Self Care | Payer: BC Managed Care – PPO | Source: Ambulatory Visit | Attending: Radiation Oncology | Admitting: Radiation Oncology

## 2015-09-14 DIAGNOSIS — Z510 Encounter for antineoplastic radiation therapy: Secondary | ICD-10-CM | POA: Diagnosis not present

## 2015-09-15 ENCOUNTER — Ambulatory Visit
Admission: RE | Admit: 2015-09-15 | Discharge: 2015-09-15 | Disposition: A | Discharge status: Home / Self Care | Payer: BC Managed Care – PPO | Source: Ambulatory Visit | Attending: Radiation Oncology | Admitting: Radiation Oncology

## 2015-09-15 DIAGNOSIS — Z510 Encounter for antineoplastic radiation therapy: Secondary | ICD-10-CM | POA: Diagnosis not present

## 2015-09-16 ENCOUNTER — Ambulatory Visit
Admission: RE | Admit: 2015-09-16 | Discharge: 2015-09-16 | Disposition: A | Discharge status: Home / Self Care | Payer: Medicare Other | Source: Ambulatory Visit | Attending: Radiation Oncology | Admitting: Radiation Oncology

## 2015-09-16 ENCOUNTER — Ambulatory Visit
Admission: RE | Admit: 2015-09-16 | Discharge: 2015-09-16 | Disposition: A | Discharge status: Home / Self Care | Payer: BC Managed Care – PPO | Source: Ambulatory Visit | Attending: Radiation Oncology | Admitting: Radiation Oncology

## 2015-09-16 VITALS — BP 151/90 | HR 56 | Temp 97.8°F | Ht 68.0 in | Wt 230.0 lb

## 2015-09-16 DIAGNOSIS — Z510 Encounter for antineoplastic radiation therapy: Secondary | ICD-10-CM | POA: Diagnosis not present

## 2015-09-16 DIAGNOSIS — C61 Malignant neoplasm of prostate: Secondary | ICD-10-CM

## 2015-09-16 NOTE — Progress Notes (Signed)
   Department of Radiation Oncology  Phone:  [phone] Fax:        [phone]  Weekly Treatment Note    Name: Andrew Ho Date: 09/18/2015 MRN: [id number] DOB: 1946/01/16   Diagnosis:     ICD-9-CM ICD-10-CM   68. 70 yo man with detectable PSA of 0.09 s/p prostatectomy with focally positive seminal vesicles and 1 out 15 nodes positive with Gleason 3+4- Stage pT3b N1 185 C61      Current dose: 25.2 Gy  Current fraction: 14   MEDICATIONS: Current Outpatient Prescriptions  Medication Sig Dispense Refill  . allopurinol (ZYLOPRIM) 300 MG tablet 300 MG BY MOUTH DAILY AFTER SUPPER  0  . aspirin 81 MG tablet Take 81 mg by mouth daily.    Marland Kitchen atenolol (TENORMIN) 50 MG tablet TAKE 50 MG BY MOUTH DAILY AT BEDTIME  0  . atorvastatin (LIPITOR) 20 MG tablet TAKE 20 MG BY MOUTH DAILY AT BEDTIME  0  . COLCRYS 0.6 MG tablet TAKE 0.6 MG BY MOUTH DAILY AFTER SUPPER  0  . losartan (COZAAR) 100 MG tablet     . metFORMIN (GLUCOPHAGE) 500 MG tablet [phone] MG BY MOUTH DAILY WITH MEALS  0   No current facility-administered medications for this encounter.      ALLERGIES: Review of patient's allergies indicates no known allergies.   LABORATORY DATA:  Lab Results  Component Value Date   WBC 8.3 04/11/2015   HGB 12.5 (L) 04/19/2015   HCT 38.1 (L) 04/19/2015   MCV 85.3 04/11/2015   PLT 225 04/11/2015   Lab Results  Component Value Date   NA 139 04/11/2015   K 4.6 04/11/2015   CL 105 04/11/2015   CO2 24 04/11/2015   No results found for: ALT, AST, GGT, ALKPHOS, BILITOT   NARRATIVE: Andrew Ho was seen today for weekly treatment management. The chart was checked and the patient's films were reviewed.  Andrew Ho has completed 14 fractions to his prostate.  He denies having pain, hematuria, or diarrhea.  He does report occasional discomfort when urinating.  He reports nocturia x 0-1 per night.  He reports fatigue in the evenings.  PHYSICAL EXAMINATION: height is 5\' 8"  (1.727 m)  and weight is 230 lb (104.3 kg). His oral temperature is 97.8 F (36.6 C). His blood pressure is 151/90 (abnormal) and his pulse is 56 (abnormal). His oxygen saturation is 100%.      Alert, in no acute distress.  ASSESSMENT: The patient is doing satisfactorily with treatment.  PLAN: We will continue with the patient's radiation treatment as planned.   ------------------------------------------------  Andrew Gross, MD, PhD  This document serves as a record of services personally performed by Kyung Rudd, MD. It was created on his behalf by Darcus Austin, a trained medical scribe. The creation of this record is based on the scribe's personal observations and the provider's statements to them. This document has been checked and approved by the attending provider.

## 2015-09-16 NOTE — Progress Notes (Signed)
Andrew Ho has completed 14 fractions to his prostate.  He denies having pain.  He does report occasional discomfort when urinating.  He reports nocturia 0-1 times per night.  He denies having hematuria or diarrhea.  He reports fatigue in the evenings.  BP (!) 151/90 (BP Location: Left Arm, Patient Position: Sitting)   Pulse (!) 56   Temp 97.8 F (36.6 C) (Oral)   Ht 5\' 8"  (1.727 m)   Wt 230 lb (104.3 kg)   SpO2 100%   BMI 34.97 kg/m    Wt Readings from Last 3 Encounters:  09/16/15 230 lb (104.3 kg)  09/08/15 224 lb 12.8 oz (102 kg)  09/02/15 232 lb 3.2 oz (105.3 kg)

## 2015-09-19 ENCOUNTER — Ambulatory Visit
Admission: RE | Admit: 2015-09-19 | Discharge: 2015-09-19 | Disposition: A | Discharge status: Home / Self Care | Payer: BC Managed Care – PPO | Source: Ambulatory Visit | Attending: Radiation Oncology | Admitting: Radiation Oncology

## 2015-09-19 DIAGNOSIS — Z510 Encounter for antineoplastic radiation therapy: Secondary | ICD-10-CM | POA: Diagnosis not present

## 2015-09-20 ENCOUNTER — Encounter: Payer: Self-pay | Admitting: Medical Oncology

## 2015-09-20 ENCOUNTER — Ambulatory Visit
Admission: RE | Admit: 2015-09-20 | Discharge: 2015-09-20 | Disposition: A | Discharge status: Home / Self Care | Payer: BC Managed Care – PPO | Source: Ambulatory Visit | Attending: Radiation Oncology | Admitting: Radiation Oncology

## 2015-09-20 DIAGNOSIS — Z510 Encounter for antineoplastic radiation therapy: Secondary | ICD-10-CM | POA: Diagnosis not present

## 2015-09-21 ENCOUNTER — Ambulatory Visit
Admission: RE | Admit: 2015-09-21 | Discharge: 2015-09-21 | Disposition: A | Discharge status: Home / Self Care | Payer: BC Managed Care – PPO | Source: Ambulatory Visit | Attending: Radiation Oncology | Admitting: Radiation Oncology

## 2015-09-21 DIAGNOSIS — Z510 Encounter for antineoplastic radiation therapy: Secondary | ICD-10-CM | POA: Diagnosis not present

## 2015-09-22 ENCOUNTER — Ambulatory Visit
Admission: RE | Admit: 2015-09-22 | Discharge: 2015-09-22 | Disposition: A | Discharge status: Home / Self Care | Payer: BC Managed Care – PPO | Source: Ambulatory Visit | Attending: Radiation Oncology | Admitting: Radiation Oncology

## 2015-09-22 ENCOUNTER — Encounter: Payer: Self-pay | Admitting: Radiation Oncology

## 2015-09-22 VITALS — BP 153/83 | HR 57 | Temp 97.8°F | Ht 68.0 in | Wt 230.3 lb

## 2015-09-22 DIAGNOSIS — Z510 Encounter for antineoplastic radiation therapy: Secondary | ICD-10-CM | POA: Diagnosis not present

## 2015-09-22 DIAGNOSIS — C61 Malignant neoplasm of prostate: Secondary | ICD-10-CM

## 2015-09-22 NOTE — Progress Notes (Signed)
Andrew Ho is here for his 18th fraction of radiation to his prostate. He denies pain at this time. He tells me he does have some burning with urination. He also noticed faint pink after voiding this morning. He denies observing any red blood. He tells me he did have clear yellow urine when voiding a second time this morning. He denies frequency, and tells me he will get up once at night at times, but other nights will not get up at all. He reports a strong urine stream and feels like his bladder is emptying well. He reports normal bowel movements at this time. He is eating well.   BP (!) 153/83   Pulse (!) 57   Temp 97.8 F (36.6 C)   Ht 5\' 8"  (1.727 m)   Wt 230 lb 4.8 oz (104.5 kg)   SpO2 100% Comment: room air  BMI 35.02 kg/m    Wt Readings from Last 3 Encounters:  09/22/15 230 lb 4.8 oz (104.5 kg)  09/16/15 230 lb (104.3 kg)  09/08/15 224 lb 12.8 oz (102 kg)

## 2015-09-22 NOTE — Progress Notes (Signed)
  Radiation Oncology         [phone]   Name: Andrew Ho MRN: [id number]   Date: 09/22/2015  DOB: 12-07-1945     Weekly Radiation Therapy Management    ICD-9-CM ICD-10-CM   1. 70 yo man with detectable PSA of 0.09 s/p prostatectomy with focally positive seminal vesicles and 1 out 15 nodes positive with Gleason 3+4- Stage pT3b N1 185 C61     Current Dose:  32.4Gy  Planned Dose:  45 Gy  Narrative The patient presents for routine under treatment assessment.  The patient denies pain. He states he has dysuria. He also noticed a faint pink after urinating this morning. He denies observing any red blood. He admits his second urine output this morning was normal. He reports a strong urine stream and emptying well. He denise frequency, and reports occasional nocturia x 0-1. He reports normal bowel movements at this time. He has a good appetite.  Set-up films were reviewed. The chart was checked.  Physical Findings  height is 5\' 8"  (1.727 m) and weight is 230 lb 4.8 oz (104.5 kg). His temperature is 97.8 F (36.6 C). His blood pressure is 153/83 (abnormal) and his pulse is 57 (abnormal). His oxygen saturation is 100%. . Weight essentially stable.  No significant changes.  Impression The patient is tolerating radiation.  Plan Continue treatment as planned.         Sheral Apley Tammi Klippel, M.D.  This document serves as a record of services personally performed by Tyler Pita, MD. It was created on his behalf by Bethann Humble, a trained medical scribe. The creation of this record is based on the scribe's personal observations and the provider's statements to them. This document has been checked and approved by the attending provider.

## 2015-09-23 ENCOUNTER — Telehealth: Payer: Self-pay | Admitting: Radiation Oncology

## 2015-09-23 ENCOUNTER — Ambulatory Visit
Admission: RE | Admit: 2015-09-23 | Discharge: 2015-09-23 | Disposition: A | Discharge status: Home / Self Care | Payer: BC Managed Care – PPO | Source: Ambulatory Visit | Attending: Radiation Oncology | Admitting: Radiation Oncology

## 2015-09-23 ENCOUNTER — Other Ambulatory Visit: Payer: Self-pay | Admitting: Radiation Oncology

## 2015-09-23 DIAGNOSIS — R300 Dysuria: Secondary | ICD-10-CM | POA: Insufficient documentation

## 2015-09-23 DIAGNOSIS — Z510 Encounter for antineoplastic radiation therapy: Secondary | ICD-10-CM | POA: Diagnosis not present

## 2015-09-23 LAB — URINALYSIS, MICROSCOPIC - CHCC
BILIRUBIN (URINE): NEGATIVE
Glucose: NEGATIVE mg/dL
Ketones: NEGATIVE mg/dL
LEUKOCYTE ESTERASE: NEGATIVE
Nitrite: NEGATIVE
Protein: NEGATIVE mg/dL
SPECIFIC GRAVITY, URINE: 1.005 (ref 1.003–1.035)
UROBILINOGEN UR: 0.2 mg/dL (ref 0.2–1)
pH: 6.5 (ref 4.6–8.0)

## 2015-09-23 NOTE — Telephone Encounter (Signed)
-----   Message from Tyler Pita, MD sent at 09/23/2015 12:05 PM EDT ----- Please call patient with normal result.  Thanks. MM

## 2015-09-23 NOTE — Progress Notes (Signed)
Please call patient with normal result.  Thanks. MM 

## 2015-09-23 NOTE — Telephone Encounter (Signed)
Phoned patient to explain that per Dr. Tammi Klippel his urinalysis came back normal. Patient confirms dysuria is less with increased fluid intake but, no completely resolved. Patient understands that if his dysuria becomes unbearable over weekend holiday AZO over the counter is safe to take. Patient expressed appreciation for the call. Will watch for culture results since patient has a history of UTI.

## 2015-09-24 LAB — URINE CULTURE: ORGANISM ID, BACTERIA: NO GROWTH

## 2015-09-26 NOTE — Progress Notes (Signed)
Please call patient with normal result.  Thanks. MM 

## 2015-09-27 ENCOUNTER — Ambulatory Visit
Admission: RE | Admit: 2015-09-27 | Discharge: 2015-09-27 | Disposition: A | Discharge status: Home / Self Care | Payer: BC Managed Care – PPO | Source: Ambulatory Visit | Attending: Radiation Oncology | Admitting: Radiation Oncology

## 2015-09-27 DIAGNOSIS — Z510 Encounter for antineoplastic radiation therapy: Secondary | ICD-10-CM | POA: Diagnosis not present

## 2015-09-28 ENCOUNTER — Telehealth: Payer: Self-pay | Admitting: Radiation Oncology

## 2015-09-28 ENCOUNTER — Ambulatory Visit
Admission: RE | Admit: 2015-09-28 | Discharge: 2015-09-28 | Disposition: A | Discharge status: Home / Self Care | Payer: BC Managed Care – PPO | Source: Ambulatory Visit | Attending: Radiation Oncology | Admitting: Radiation Oncology

## 2015-09-28 DIAGNOSIS — Z510 Encounter for antineoplastic radiation therapy: Secondary | ICD-10-CM | POA: Diagnosis not present

## 2015-09-28 NOTE — Telephone Encounter (Signed)
Phoned patient to inform him that his urine culture results returned normal. No answer. Left message requesting return call.

## 2015-09-29 ENCOUNTER — Ambulatory Visit
Admission: RE | Admit: 2015-09-29 | Discharge: 2015-09-29 | Disposition: A | Discharge status: Home / Self Care | Payer: BC Managed Care – PPO | Source: Ambulatory Visit | Attending: Radiation Oncology | Admitting: Radiation Oncology

## 2015-09-29 ENCOUNTER — Ambulatory Visit
Admission: RE | Admit: 2015-09-29 | Discharge: 2015-09-29 | Disposition: A | Discharge status: Home / Self Care | Payer: Medicare Other | Source: Ambulatory Visit | Attending: Radiation Oncology | Admitting: Radiation Oncology

## 2015-09-29 VITALS — BP 170/82 | HR 55 | Wt 230.8 lb

## 2015-09-29 DIAGNOSIS — C61 Malignant neoplasm of prostate: Secondary | ICD-10-CM

## 2015-09-29 DIAGNOSIS — Z510 Encounter for antineoplastic radiation therapy: Secondary | ICD-10-CM | POA: Diagnosis not present

## 2015-09-29 NOTE — Progress Notes (Signed)
  Radiation Oncology         [phone]   Name: Andrew Ho MRN: [id number]   Date: 09/29/2015  DOB: 18-Jul-1945     Weekly Radiation Therapy Management    ICD-9-CM ICD-10-CM   1. 70 yo man with detectable PSA of 0.09 s/p prostatectomy with focally positive seminal vesicles and 1 out 15 nodes positive with Gleason 3+4- Stage pT3b N1 185 C61     Current Dose:  39.6 Gy  Planned Dose:  45 Gy  Narrative The patient presents for routine under treatment assessment.  Weight and vitals stable. Denies pain. Denies nocturia. Reports increasing his fluid intake and helped resolve dysuria. Denies hematuria. Denies urgency, leakage, or incontinence. Denies urinary frequency. Describes a steady urine stream without difficulty emptying his bladder. Denies diarrhea. Denies fatigue. He goes to the gym regularly or walks. He will keep an eye on his blood pressure this coming week and will bring in a written document of these measures to his next visit.  Set-up films were reviewed. The chart was checked.  Physical Findings  weight is 230 lb 12.8 oz (104.7 kg). His blood pressure is 170/82 (abnormal) and his pulse is 55 (abnormal). . Weight essentially stable.  No significant changes. Alert, in no acute distress.   Impression The patient is tolerating radiation.  Plan Continue treatment as planned.   Sheral Apley Tammi Klippel, M.D.  This document serves as a record of services personally performed by Tyler Pita, MD. It was created on his behalf by Arlyce Harman, a trained medical scribe. The creation of this record is based on the scribe's personal observations and the provider's statements to them. This document has been checked and approved by the attending provider.

## 2015-09-29 NOTE — Progress Notes (Signed)
Weight and vitals stable. Denies pain. Denies nocturia. Reports increasing his fluid intake and helped resolve dysuria. Denies hematuria. Denies urgency, leakage or incontinence. Denies urinary frequency. Describes a steady urine stream without difficulty emptying his bladder. Denies diarrhea. Denies fatigue.   BP (!) 170/82 (BP Location: Left Arm, Patient Position: Sitting, Cuff Size: Large)   Pulse (!) 55   Wt 230 lb 12.8 oz (104.7 kg)   BMI 35.09 kg/m  Wt Readings from Last 3 Encounters:  09/29/15 230 lb 12.8 oz (104.7 kg)  09/22/15 230 lb 4.8 oz (104.5 kg)  09/16/15 230 lb (104.3 kg)

## 2015-09-30 ENCOUNTER — Ambulatory Visit
Admission: RE | Admit: 2015-09-30 | Discharge: 2015-09-30 | Disposition: A | Discharge status: Home / Self Care | Payer: BC Managed Care – PPO | Source: Ambulatory Visit | Attending: Radiation Oncology | Admitting: Radiation Oncology

## 2015-09-30 DIAGNOSIS — Z510 Encounter for antineoplastic radiation therapy: Secondary | ICD-10-CM | POA: Diagnosis not present

## 2015-10-03 ENCOUNTER — Ambulatory Visit
Admission: RE | Admit: 2015-10-03 | Discharge: 2015-10-03 | Disposition: A | Discharge status: Home / Self Care | Payer: BC Managed Care – PPO | Source: Ambulatory Visit | Attending: Radiation Oncology | Admitting: Radiation Oncology

## 2015-10-03 DIAGNOSIS — Z510 Encounter for antineoplastic radiation therapy: Secondary | ICD-10-CM | POA: Diagnosis not present

## 2015-10-04 ENCOUNTER — Ambulatory Visit
Admission: RE | Admit: 2015-10-04 | Discharge: 2015-10-04 | Disposition: A | Discharge status: Home / Self Care | Payer: BC Managed Care – PPO | Source: Ambulatory Visit | Attending: Radiation Oncology | Admitting: Radiation Oncology

## 2015-10-04 DIAGNOSIS — Z510 Encounter for antineoplastic radiation therapy: Secondary | ICD-10-CM | POA: Diagnosis not present

## 2015-10-05 ENCOUNTER — Ambulatory Visit: Payer: BC Managed Care – PPO

## 2015-10-05 DIAGNOSIS — Z510 Encounter for antineoplastic radiation therapy: Secondary | ICD-10-CM | POA: Diagnosis not present

## 2015-10-06 ENCOUNTER — Ambulatory Visit: Payer: BC Managed Care – PPO

## 2015-10-06 DIAGNOSIS — Z510 Encounter for antineoplastic radiation therapy: Secondary | ICD-10-CM | POA: Diagnosis not present

## 2015-10-07 ENCOUNTER — Ambulatory Visit: Payer: BC Managed Care – PPO

## 2015-10-07 ENCOUNTER — Ambulatory Visit: Admission: RE | Admit: 2015-10-07 | Payer: Medicare Other | Source: Ambulatory Visit | Admitting: Radiation Oncology

## 2015-10-10 ENCOUNTER — Encounter: Payer: Self-pay | Admitting: Radiation Oncology

## 2015-10-10 ENCOUNTER — Ambulatory Visit
Admission: RE | Admit: 2015-10-10 | Discharge: 2015-10-10 | Disposition: A | Discharge status: Home / Self Care | Payer: BC Managed Care – PPO | Source: Ambulatory Visit | Attending: Radiation Oncology | Admitting: Radiation Oncology

## 2015-10-10 ENCOUNTER — Ambulatory Visit
Admission: RE | Admit: 2015-10-10 | Discharge: 2015-10-10 | Disposition: A | Discharge status: Home / Self Care | Payer: Medicare Other | Source: Ambulatory Visit | Attending: Radiation Oncology | Admitting: Radiation Oncology

## 2015-10-10 VITALS — BP 165/91 | HR 55 | Resp 16 | Wt 231.1 lb

## 2015-10-10 DIAGNOSIS — Z510 Encounter for antineoplastic radiation therapy: Secondary | ICD-10-CM | POA: Diagnosis not present

## 2015-10-10 DIAGNOSIS — C61 Malignant neoplasm of prostate: Secondary | ICD-10-CM

## 2015-10-10 NOTE — Progress Notes (Signed)
  Radiation Oncology         [phone]   Name: Andrew Ho MRN: [id number]   Date: 10/10/2015  DOB: 01-06-1946     Weekly Radiation Therapy Management    ICD-9-CM ICD-10-CM   1. 70 yo man with detectable PSA of 0.09 s/p prostatectomy with focally positive seminal vesicles and 1 out 15 nodes positive with Gleason 3+4- Stage pT3b N1 185 C61     Current Dose:  50.4 Gy  Planned Dose:  68.4 Gy  Narrative The patient presents for routine under treatment assessment.  Weight and vitals stable. Denies pain. Denies nocturia. Reports increasing his fluid intake and helped resolve dysuria and hematuria. Denies urgency, leakage, or incontinence. Denies urinary frequency. Describes a steady urine stream without difficulty emptying his bladder. Denies diarrhea. Denies fatigue.  Set-up films were reviewed. The chart was checked.  Physical Findings  weight is 231 lb 1.6 oz (104.8 kg). His blood pressure is 165/91 (abnormal) and his pulse is 55 (abnormal). His respiration is 16 and oxygen saturation is 100%. . Weight essentially stable.  No significant changes. Alert, in no acute distress.   Impression The patient is tolerating radiation.  Plan Continue treatment as planned.  To follow-up with PCP Dr. Nyoka Cowden in Oct regarding BP.   Sheral Apley Tammi Klippel, M.D.  This document serves as a record of services personally performed by Tyler Pita, MD. It was created on his behalf by Bethann Humble, a trained medical scribe. The creation of this record is based on the scribe's personal observations and the provider's statements to them. This document has been checked and approved by the attending provider.

## 2015-10-10 NOTE — Progress Notes (Addendum)
Weight and vitals stable. Denies pain. Denies nocturia. Reports increasing his fluid intake and helped resolve dysuria and hematuria. Denies urgency, leakage or incontinence. Denies urinary frequency. Describes a steady urine stream without difficulty emptying his bladder. Denies diarrhea. Denies fatigue.  BP (!) 165/91 (BP Location: Left Arm, Patient Position: Sitting, Cuff Size: Large)   Pulse (!) 55   Resp 16   Wt 231 lb 1.6 oz (104.8 kg)   SpO2 100%   BMI 35.14 kg/m  Wt Readings from Last 3 Encounters:  10/10/15 231 lb 1.6 oz (104.8 kg)  09/29/15 230 lb 12.8 oz (104.7 kg)  09/22/15 230 lb 4.8 oz (104.5 kg)

## 2015-10-11 ENCOUNTER — Ambulatory Visit
Admission: RE | Admit: 2015-10-11 | Discharge: 2015-10-11 | Disposition: A | Discharge status: Home / Self Care | Payer: BC Managed Care – PPO | Source: Ambulatory Visit | Attending: Radiation Oncology | Admitting: Radiation Oncology

## 2015-10-11 DIAGNOSIS — Z510 Encounter for antineoplastic radiation therapy: Secondary | ICD-10-CM | POA: Diagnosis not present

## 2015-10-12 ENCOUNTER — Ambulatory Visit
Admission: RE | Admit: 2015-10-12 | Discharge: 2015-10-12 | Disposition: A | Discharge status: Home / Self Care | Payer: BC Managed Care – PPO | Source: Ambulatory Visit | Attending: Radiation Oncology | Admitting: Radiation Oncology

## 2015-10-12 DIAGNOSIS — Z510 Encounter for antineoplastic radiation therapy: Secondary | ICD-10-CM | POA: Diagnosis not present

## 2015-10-13 ENCOUNTER — Ambulatory Visit
Admission: RE | Admit: 2015-10-13 | Discharge: 2015-10-13 | Disposition: A | Discharge status: Home / Self Care | Payer: BC Managed Care – PPO | Source: Ambulatory Visit | Attending: Radiation Oncology | Admitting: Radiation Oncology

## 2015-10-13 DIAGNOSIS — Z510 Encounter for antineoplastic radiation therapy: Secondary | ICD-10-CM | POA: Diagnosis not present

## 2015-10-14 ENCOUNTER — Ambulatory Visit
Admission: RE | Admit: 2015-10-14 | Discharge: 2015-10-14 | Disposition: A | Discharge status: Home / Self Care | Payer: BC Managed Care – PPO | Source: Ambulatory Visit | Attending: Radiation Oncology | Admitting: Radiation Oncology

## 2015-10-14 ENCOUNTER — Encounter: Payer: Self-pay | Admitting: Radiation Oncology

## 2015-10-14 VITALS — BP 168/92 | HR 62 | Resp 18 | Wt 227.5 lb

## 2015-10-14 DIAGNOSIS — Z510 Encounter for antineoplastic radiation therapy: Secondary | ICD-10-CM | POA: Diagnosis not present

## 2015-10-14 DIAGNOSIS — C61 Malignant neoplasm of prostate: Secondary | ICD-10-CM

## 2015-10-14 NOTE — Progress Notes (Signed)
Weight and vitals stable. Denies pain. Denies nocturia. Denies urinary frequency. Describes a strong steady urine stream without difficulty emptying bladder. Reports mild discomfort with urination. Denies hematuria. Denies diarrhea.  Educated patient reference use of home bp machine. Patient verbalized understanding. Denies fatigue.  BP (!) 168/92 (BP Location: Right Arm, Patient Position: Standing, Cuff Size: Normal)   Pulse 62   Resp 18   Wt 227 lb 8 oz (103.2 kg)   SpO2 100%   BMI 34.59 kg/m  Wt Readings from Last 3 Encounters:  10/14/15 227 lb 8 oz (103.2 kg)  10/10/15 231 lb 1.6 oz (104.8 kg)  09/29/15 230 lb 12.8 oz (104.7 kg)

## 2015-10-14 NOTE — Progress Notes (Signed)
   Department of Radiation Oncology  Phone:  [phone] Fax:        [phone]  Weekly Treatment Note    Name: Andrew Ho Date: 10/15/2015 MRN: [id number] DOB: Mar 10, 1945   Diagnosis:     ICD-9-CM ICD-10-CM   71. 70 yo man with detectable PSA of 0.09 s/p prostatectomy with focally positive seminal vesicles and 1 out 15 nodes positive with Gleason 3+4- Stage pT3b N1 185 C61      Current dose: 57.7 Gy  Current fraction: 32   MEDICATIONS: Current Outpatient Prescriptions  Medication Sig Dispense Refill  . allopurinol (ZYLOPRIM) 300 MG tablet 300 MG BY MOUTH DAILY AFTER SUPPER  0  . aspirin 81 MG tablet Take 81 mg by mouth daily.    Marland Kitchen atenolol (TENORMIN) 50 MG tablet TAKE 50 MG BY MOUTH DAILY AT BEDTIME  0  . atorvastatin (LIPITOR) 20 MG tablet TAKE 20 MG BY MOUTH DAILY AT BEDTIME  0  . COLCRYS 0.6 MG tablet TAKE 0.6 MG BY MOUTH DAILY AFTER SUPPER  0  . losartan (COZAAR) 100 MG tablet     . metFORMIN (GLUCOPHAGE) 500 MG tablet [phone] MG BY MOUTH DAILY WITH MEALS  0   No current facility-administered medications for this encounter.      ALLERGIES: Review of patient's allergies indicates no known allergies.   LABORATORY DATA:  Lab Results  Component Value Date   WBC 8.3 04/11/2015   HGB 12.5 (L) 04/19/2015   HCT 38.1 (L) 04/19/2015   MCV 85.3 04/11/2015   PLT 225 04/11/2015   Lab Results  Component Value Date   NA 139 04/11/2015   K 4.6 04/11/2015   CL 105 04/11/2015   CO2 24 04/11/2015   No results found for: ALT, AST, GGT, ALKPHOS, BILITOT   NARRATIVE: Andrew Ho was seen today for weekly treatment management. The chart was checked and the patient's films were reviewed.  Weight and vitals stable. Denies pain. Denies urinary frequency. Describes a strong steady urine stream without emptying his bladder. Reports mild discomfort with urination. Denies hematuria, diarrhea. Educated patient reference use of home BP machine, patient verbalized  understanding. Patient denies fatigue.  PHYSICAL EXAMINATION: weight is 227 lb 8 oz (103.2 kg). His blood pressure is 168/92 (abnormal) and his pulse is 62. His respiration is 18 and oxygen saturation is 100%.        ASSESSMENT: The patient is doing satisfactorily with treatment.  PLAN: We will continue with the patient's radiation treatment as planned.        This document serves as a record of services personally performed by Kyung Rudd, MD. It was created on his behalf by Bethann Humble, a trained medical scribe. The creation of this record is based on the scribe's personal observations and the provider's statements to them. This document has been checked and approved by the attending provider.

## 2015-10-17 ENCOUNTER — Ambulatory Visit
Admission: RE | Admit: 2015-10-17 | Discharge: 2015-10-17 | Disposition: A | Discharge status: Home / Self Care | Payer: BC Managed Care – PPO | Source: Ambulatory Visit | Attending: Radiation Oncology | Admitting: Radiation Oncology

## 2015-10-17 DIAGNOSIS — Z510 Encounter for antineoplastic radiation therapy: Secondary | ICD-10-CM | POA: Diagnosis not present

## 2015-10-18 ENCOUNTER — Ambulatory Visit
Admission: RE | Admit: 2015-10-18 | Discharge: 2015-10-18 | Disposition: A | Discharge status: Home / Self Care | Payer: BC Managed Care – PPO | Source: Ambulatory Visit | Attending: Radiation Oncology | Admitting: Radiation Oncology

## 2015-10-18 DIAGNOSIS — Z510 Encounter for antineoplastic radiation therapy: Secondary | ICD-10-CM | POA: Diagnosis not present

## 2015-10-19 ENCOUNTER — Ambulatory Visit
Admission: RE | Admit: 2015-10-19 | Discharge: 2015-10-19 | Disposition: A | Discharge status: Home / Self Care | Payer: BC Managed Care – PPO | Source: Ambulatory Visit | Attending: Radiation Oncology | Admitting: Radiation Oncology

## 2015-10-19 ENCOUNTER — Encounter: Payer: Self-pay | Admitting: Medical Oncology

## 2015-10-19 DIAGNOSIS — Z510 Encounter for antineoplastic radiation therapy: Secondary | ICD-10-CM | POA: Diagnosis not present

## 2015-10-19 NOTE — Progress Notes (Signed)
Andrew Ho states he is doing well with radiation. He is surprised how quickly the treatments have gone. He will finish treatment 10/24/15.

## 2015-10-20 ENCOUNTER — Inpatient Hospital Stay: Admission: RE | Admit: 2015-10-20 | Payer: Self-pay | Source: Ambulatory Visit | Admitting: Radiation Oncology

## 2015-10-20 ENCOUNTER — Ambulatory Visit: Payer: BC Managed Care – PPO

## 2015-10-20 DIAGNOSIS — Z510 Encounter for antineoplastic radiation therapy: Secondary | ICD-10-CM | POA: Diagnosis not present

## 2015-10-21 ENCOUNTER — Encounter: Payer: Self-pay | Admitting: Radiation Oncology

## 2015-10-21 ENCOUNTER — Ambulatory Visit
Admission: RE | Admit: 2015-10-21 | Discharge: 2015-10-21 | Disposition: A | Discharge status: Home / Self Care | Payer: BC Managed Care – PPO | Source: Ambulatory Visit | Attending: Radiation Oncology | Admitting: Radiation Oncology

## 2015-10-21 ENCOUNTER — Ambulatory Visit: Payer: BC Managed Care – PPO

## 2015-10-21 VITALS — BP 139/79 | HR 57 | Temp 98.0°F | Resp 20 | Ht 68.0 in | Wt 228.8 lb

## 2015-10-21 DIAGNOSIS — Z510 Encounter for antineoplastic radiation therapy: Secondary | ICD-10-CM | POA: Diagnosis not present

## 2015-10-21 DIAGNOSIS — C61 Malignant neoplasm of prostate: Secondary | ICD-10-CM

## 2015-10-21 NOTE — Progress Notes (Signed)
Weight and vitals stable. Denies pain. Denies nocturia. Denies urinary frequency. Describes a strong steady urine stream without difficulty emptying bladder. Reports mild discomfort with urination. Denies hematuria, saw blood stain on underpants on Sunday evening stated he had one bottle of water during the football. Denies diarrhea.  Using bp machine at home without problems.  Having mild fatigue at the end of the day.  EOT instructions given one month card to see Shona Simpson, P.A. 12-06-15. Wt Readings from Last 3 Encounters:  10/21/15 228 lb 12.8 oz (103.8 kg)  10/14/15 227 lb 8 oz (103.2 kg)  10/10/15 231 lb 1.6 oz (104.8 kg)  BP 139/79 (BP Location: Left Arm, Patient Position: Sitting, Cuff Size: Large)   Pulse (!) 57   Temp 98 F (36.7 C) (Oral)   Resp 20   Ht 5\' 8"  (1.727 m)   Wt 228 lb 12.8 oz (103.8 kg)   SpO2 98%   BMI 34.79 kg/m

## 2015-10-21 NOTE — Progress Notes (Signed)
  Radiation Oncology         [phone]   Name: Andrew Ho MRN: [id number]   Date: 10/21/2015  DOB: May 15, 1945     Weekly Radiation Therapy Management    ICD-9-CM ICD-10-CM   1. 70 yo man with detectable PSA of 0.09 s/p prostatectomy with focally positive seminal vesicles and 1 out 15 nodes positive with Gleason 3+4- Stage pT3b N1 185 C61     Current Dose:  66.6 Gy  Planned Dose:  68.4 Gy  Narrative The patient presents for routine under treatment assessment.  Denies pain, nocturia, urinary frequency, or diarrhea. Describes a strong steady urine stream without difficulty emptying bladder. Reports mild discomfort with urination. Reports seeing a blood stain on his underpants on Sunday evening and states he had only one bottle of water watch football. Using bp machine at home without problems.  Has mild fatigue at the end of the day.   Set-up films were reviewed. The chart was checked.  Physical Findings  height is 5\' 8"  (1.727 m) and weight is 228 lb 12.8 oz (103.8 kg). His oral temperature is 98 F (36.7 C). His blood pressure is 139/79 and his pulse is 57 (abnormal). His respiration is 20 and oxygen saturation is 98%. . Weight essentially stable.  No significant changes. Alert, in no acute distress.   Impression The patient is tolerating radiation.  Plan Continue treatment as planned. EOT instructions and 1 month f/u card given to see Shona Simpson, P.A. On 12-06-15.   Sheral Apley Tammi Klippel, M.D.  This document serves as a record of services personally performed by Tyler Pita, MD. It was created on his behalf by Darcus Austin, a trained medical scribe. The creation of this record is based on the scribe's personal observations and the provider's statements to them. This document has been checked and approved by the attending provider.

## 2015-10-24 ENCOUNTER — Ambulatory Visit: Payer: BC Managed Care – PPO

## 2015-10-24 ENCOUNTER — Ambulatory Visit
Admission: RE | Admit: 2015-10-24 | Discharge: 2015-10-24 | Disposition: A | Discharge status: Home / Self Care | Payer: BC Managed Care – PPO | Source: Ambulatory Visit | Attending: Radiation Oncology | Admitting: Radiation Oncology

## 2015-10-24 ENCOUNTER — Encounter: Payer: Self-pay | Admitting: Medical Oncology

## 2015-10-24 ENCOUNTER — Encounter: Payer: Self-pay | Admitting: Radiation Oncology

## 2015-10-24 DIAGNOSIS — Z510 Encounter for antineoplastic radiation therapy: Secondary | ICD-10-CM | POA: Diagnosis not present

## 2015-10-24 NOTE — Progress Notes (Signed)
Celebrated with Mr. Comas as he rang the bell after the completion of radiation treatments. He has tolerated his treatments well. He will follow up with Alean Rinne 12/06/15. I asked him to call with any question or concerns.

## 2015-10-25 ENCOUNTER — Ambulatory Visit: Payer: BC Managed Care – PPO

## 2015-10-27 NOTE — Progress Notes (Signed)
  Radiation Oncology         [phone] ________________________________  Name: Andrew Ho MRN: [id number]  Date: 10/24/2015  DOB: 05/25/1945  End of Treatment Note  Diagnosis:   70 y.o. 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 The prostate initial pelvis nodes were treated to 45 Gy in 25 fractions at 1.8 Gy per fraction. 2. The prostate fossa was boosted to 23.4 Gy in 13 fractions at 1.8 Gy per fraction.   Beams/energy:    1. IMRT // 6X 2. IMRT // 6X  Narrative: The patient tolerated radiation treatment relatively well.  He experienced mild fatigue and urinary discomfort with treatment.   Plan: The patient has completed radiation treatment. The patient will return to radiation oncology clinic for routine followup in one month. I advised him to call or return sooner if he has any questions or concerns related to his recovery or treatment. ________________________________  Sheral Apley. Tammi Klippel, M.D.   This document serves as a record of services personally performed by Tyler Pita, MD. It was created on his behalf by Arlyce Harman, a trained medical scribe. The creation of this record is based on the scribe's personal observations and the provider's statements to them. This document has been checked and approved by the attending provider.

## 2015-12-02 NOTE — Progress Notes (Signed)
Weight and vitals stable. Denies pain, nocturia, urinary frequency. Describes a strong steady urine stream without difficulty emptying bladder. Reports no discomfort with urination or hematuria.  stain on underpants two weeks ago.  Denies diarrhea.  Denies fatigue. Wt Readings from Last 3 Encounters:  12/06/15 225 lb 9.6 oz (102.3 kg)  10/21/15 228 lb 12.8 oz (103.8 kg)  10/14/15 227 lb 8 oz (103.2 kg)  BP 119/63   Pulse 65   Temp 98.1 F (36.7 C) (Oral)   Resp 20   Ht 5\' 8"  (1.727 m)   Wt 225 lb 9.6 oz (102.3 kg)   SpO2 100%   BMI 34.30 kg/m

## 2015-12-06 ENCOUNTER — Ambulatory Visit
Admission: RE | Admit: 2015-12-06 | Discharge: 2015-12-06 | Disposition: A | Discharge status: Home / Self Care | Payer: BC Managed Care – PPO | Source: Ambulatory Visit | Attending: Radiation Oncology | Admitting: Radiation Oncology

## 2015-12-06 ENCOUNTER — Encounter: Payer: Self-pay | Admitting: Radiation Oncology

## 2015-12-06 VITALS — BP 119/63 | HR 65 | Temp 98.1°F | Resp 20 | Ht 68.0 in | Wt 225.6 lb

## 2015-12-06 DIAGNOSIS — Z7984 Long term (current) use of oral hypoglycemic drugs: Secondary | ICD-10-CM | POA: Diagnosis not present

## 2015-12-06 DIAGNOSIS — Z923 Personal history of irradiation: Secondary | ICD-10-CM | POA: Diagnosis not present

## 2015-12-06 DIAGNOSIS — Z9079 Acquired absence of other genital organ(s): Secondary | ICD-10-CM | POA: Insufficient documentation

## 2015-12-06 DIAGNOSIS — Z79899 Other long term (current) drug therapy: Secondary | ICD-10-CM | POA: Diagnosis not present

## 2015-12-06 DIAGNOSIS — I10 Essential (primary) hypertension: Secondary | ICD-10-CM | POA: Insufficient documentation

## 2015-12-06 DIAGNOSIS — Z7982 Long term (current) use of aspirin: Secondary | ICD-10-CM | POA: Diagnosis not present

## 2015-12-06 DIAGNOSIS — C61 Malignant neoplasm of prostate: Secondary | ICD-10-CM | POA: Diagnosis not present

## 2015-12-06 NOTE — Progress Notes (Signed)
  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 The prostate initial pelvis nodes were treated to 45 Gy in 25 fractions at 1.8 Gy per fraction. 2. The prostate fossa was boosted to 23.4 Gy in 13 fractions at 1.8 Gy per fraction.    Narrative:  The patient returns today for routine follow-up. The patient tolerated his radiotherapy to the prostate well and had minimal urinary symptoms. He did however have difficulty with persistently elevated blood pressure. Fortunately this  appears to be well controlled with modifications in his medication. He reports he's feeling great and denies any dysuria, hematuria, frequency, or nocturia. No other complaints are noted.                            ALLERGIES:  has No Known Allergies.  Meds: Current Outpatient Prescriptions  Medication Sig Dispense Refill  . allopurinol (ZYLOPRIM) 300 MG tablet 300 MG BY MOUTH DAILY AFTER SUPPER  0  . aspirin 81 MG tablet Take 81 mg by mouth daily.    Marland Kitchen atorvastatin (LIPITOR) 20 MG tablet TAKE 20 MG BY MOUTH DAILY AT BEDTIME  0  . COLCRYS 0.6 MG tablet TAKE 0.6 MG BY MOUTH DAILY AFTER SUPPER  0  . losartan (COZAAR) 100 MG tablet     . metFORMIN (GLUCOPHAGE) 500 MG tablet [phone] MG BY MOUTH DAILY WITH MEALS  0  . metoprolol succinate (TOPROL-XL) 50 MG 24 hr tablet Take 50 mg by mouth daily. Take with or immediately following a meal.     No current facility-administered medications for this encounter.     Physical Findings:  height is 5\' 8"  (1.727 m) and weight is 225 lb 9.6 oz (102.3 kg). His oral temperature is 98.1 F (36.7 C). His blood  pressure is 119/63 and his pulse is 65. His respiration is 20 and oxygen saturation is 100%.  In general this is a well appearing African American male in no acute distress. He's alert and oriented x4 and appropriate throughout the examination. Cardiopulmonary assessment is negative for acute distress and he exhibits normal effort.   Lab Findings: Lab Results  Component Value Date   WBC 8.3 04/11/2015   HGB 12.5 (L) 04/19/2015   HCT 38.1 (L) 04/19/2015   MCV 85.3 04/11/2015   PLT 225 04/11/2015     Radiographic Findings: No results found.  Impression/Plan: 1. 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 The patient appears to be doing very well. He will return to Dr. Alinda Money in the next month or two for his first post treatment PSA. We would be happy to see him back as needed moving forward if he has questions or concerns about his previous treatment. 2. Hypertension. The patient will continue under the care of his PCP for recommendations regarding his antihypertensive medications.    Carola Rhine, PAC

## 2017-02-06 IMAGING — DX DG CHEST 2V
2 series · 2 of 2 positions shown · non-contrast
Comparison: No prior .

CLINICAL DATA: Prostate cancer.

EXAM:
CHEST  2 VIEW

[chest pa]
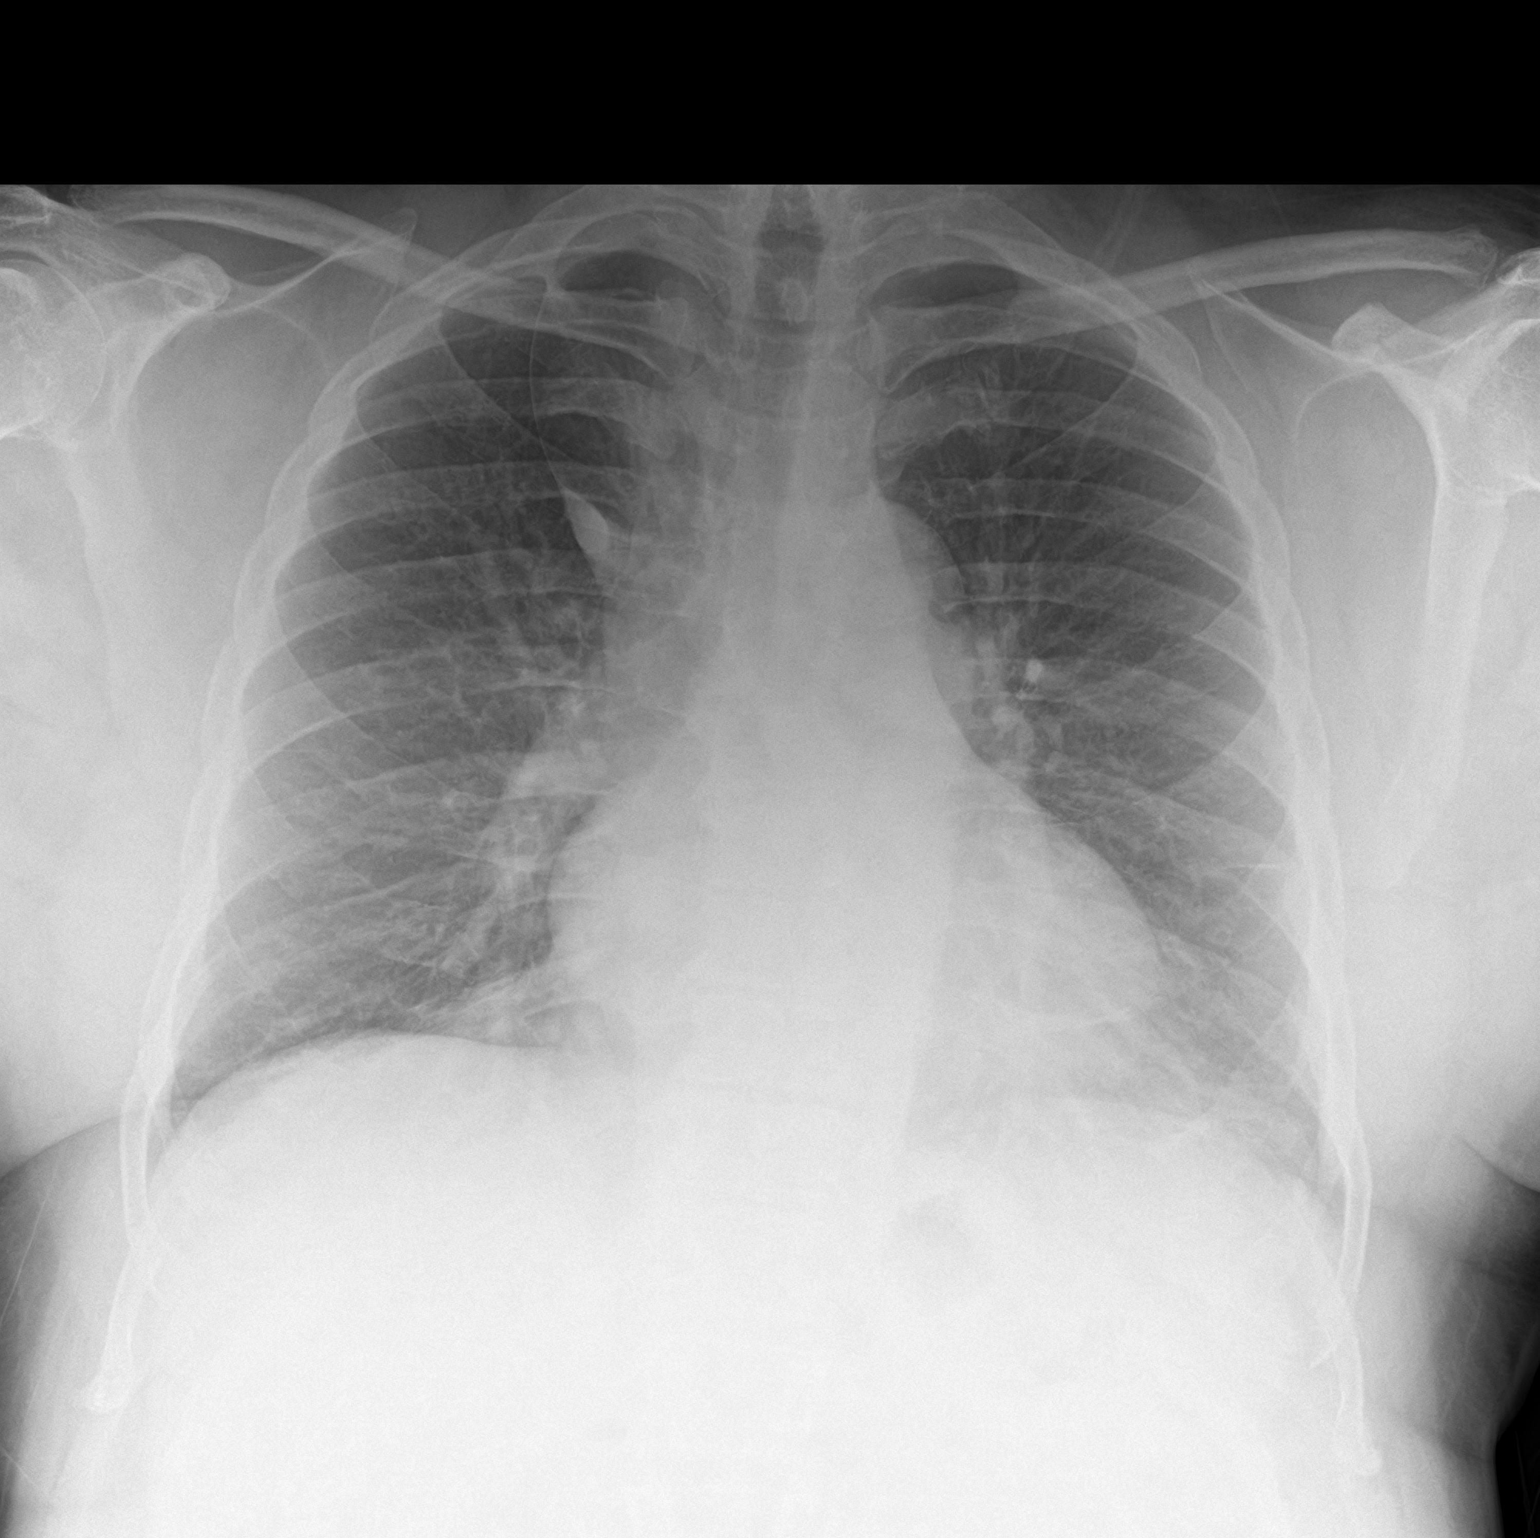

[chest lat]
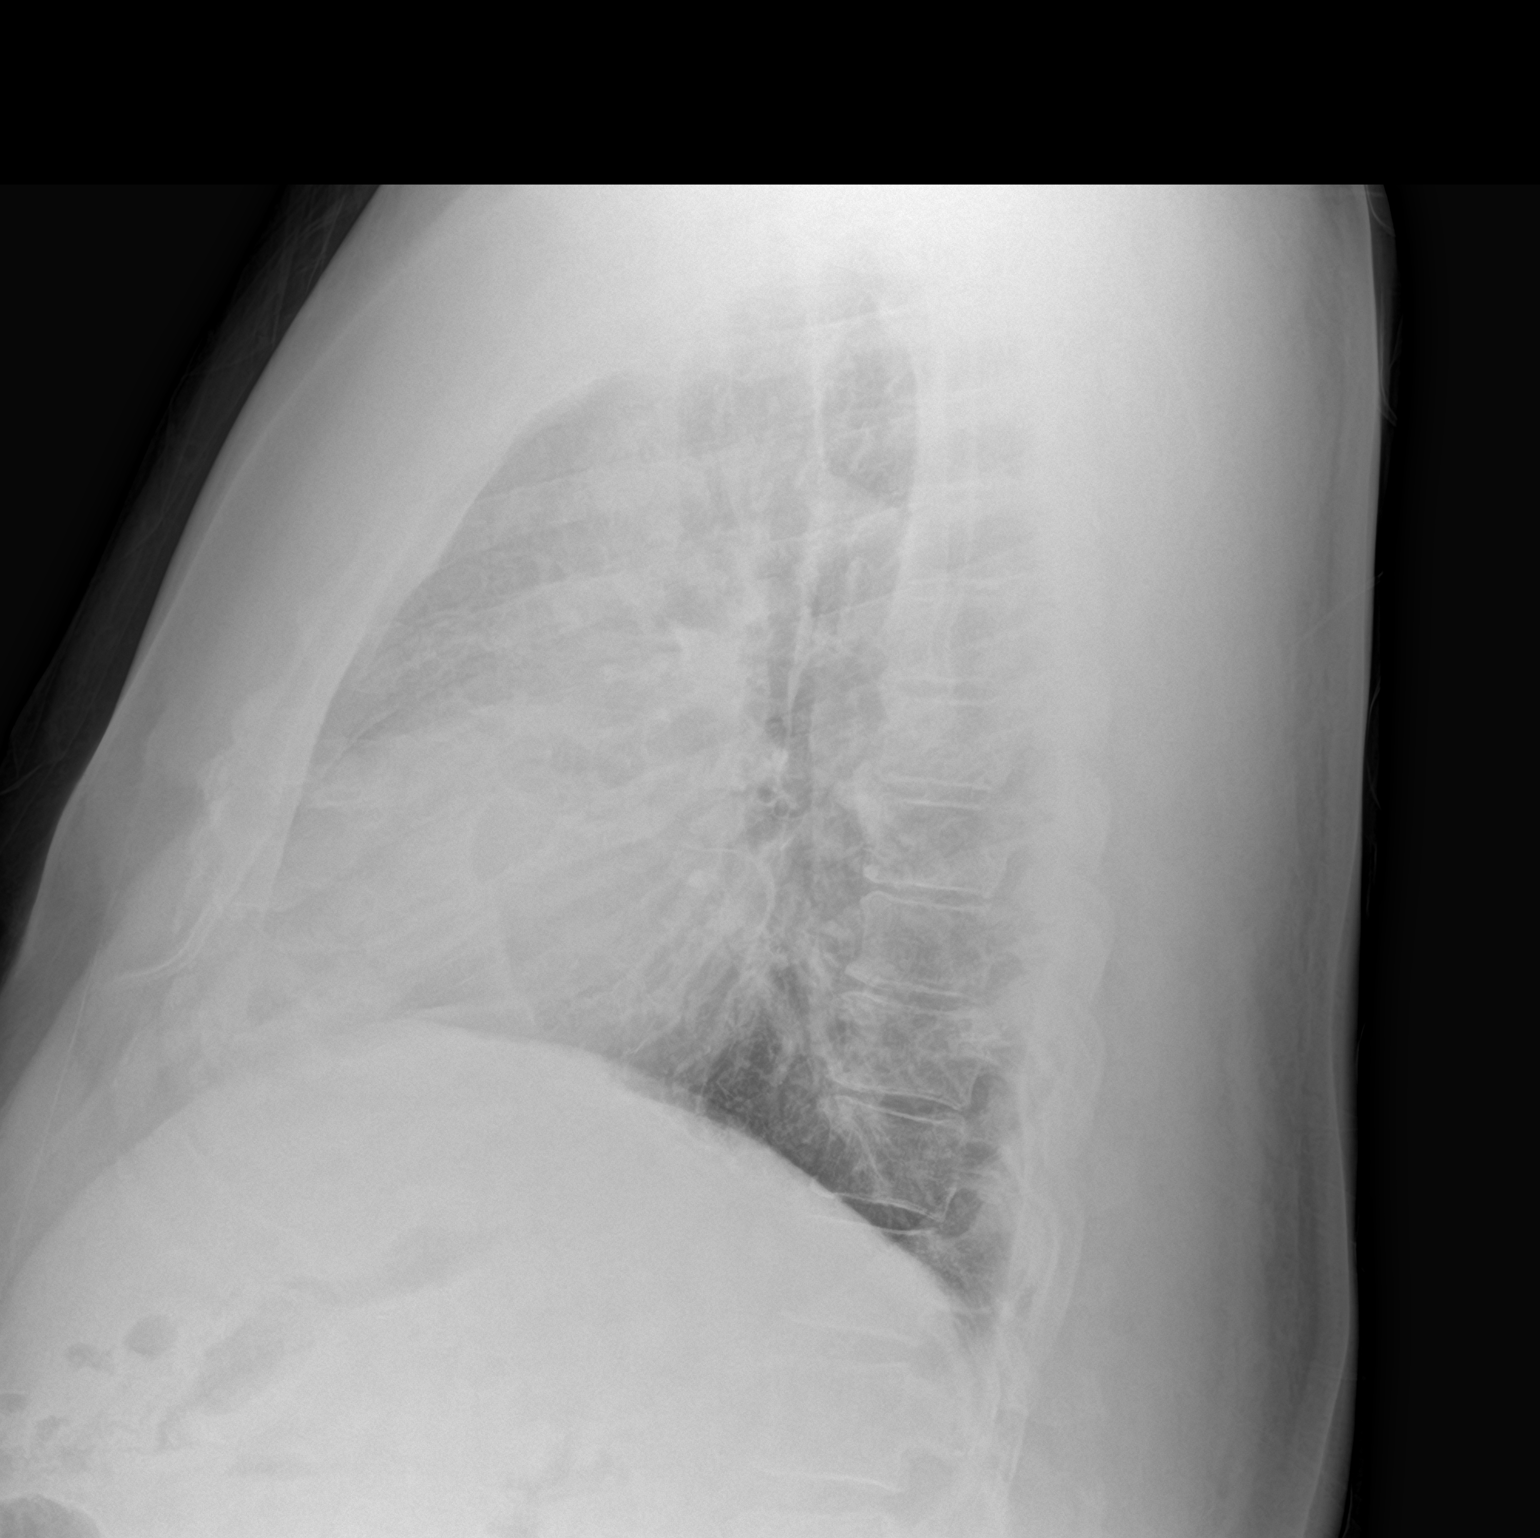

[2 of 2 positions shown; findings below may reference images not displayed]

FINDINGS: Mediastinum and hilar structures normal. Cardiomegaly with mild
pulmonary vascular prominence. No evidence of overt pulmonary edema.
No pleural effusion or pneumothorax. Degenerative change thoracic
spine.
IMPRESSION: 1. Cardiomegaly with mild pulmonary vascular prominence. No overt
pulmonary edema.

2.  Low lung volumes with mild basilar atelectasis.

## 2017-02-28 ENCOUNTER — Other Ambulatory Visit: Payer: Self-pay | Admitting: Internal Medicine

## 2017-02-28 DIAGNOSIS — R112 Nausea with vomiting, unspecified: Secondary | ICD-10-CM

## 2017-03-01 ENCOUNTER — Ambulatory Visit
Admission: RE | Admit: 2017-03-01 | Discharge: 2017-03-01 | Disposition: A | Discharge status: Home / Self Care | Payer: BC Managed Care – PPO | Source: Ambulatory Visit | Attending: Internal Medicine | Admitting: Internal Medicine

## 2017-03-01 DIAGNOSIS — R112 Nausea with vomiting, unspecified: Secondary | ICD-10-CM

## 2017-03-01 MED ORDER — IOPAMIDOL (ISOVUE-300) INJECTION 61%
125.0000 mL | Freq: Once | INTRAVENOUS | Status: AC | PRN
  Administered 2017-03-01: 125 mL via INTRAVENOUS

## 2017-03-04 ENCOUNTER — Other Ambulatory Visit: Payer: Self-pay | Admitting: Internal Medicine

## 2017-03-04 DIAGNOSIS — R112 Nausea with vomiting, unspecified: Secondary | ICD-10-CM

## 2017-03-04 DIAGNOSIS — R102 Pelvic and perineal pain: Secondary | ICD-10-CM

## 2017-03-06 ENCOUNTER — Other Ambulatory Visit: Payer: BC Managed Care – PPO

## 2017-03-08 ENCOUNTER — Ambulatory Visit
Admission: RE | Admit: 2017-03-08 | Discharge: 2017-03-08 | Disposition: A | Discharge status: Home / Self Care | Payer: BC Managed Care – PPO | Source: Ambulatory Visit | Attending: Internal Medicine | Admitting: Internal Medicine

## 2017-03-08 DIAGNOSIS — R102 Pelvic and perineal pain: Secondary | ICD-10-CM

## 2017-03-08 DIAGNOSIS — R112 Nausea with vomiting, unspecified: Secondary | ICD-10-CM

## 2019-01-04 IMAGING — CT CT PELVIS W/O CM
1 series · 14 of 32 positions shown, 18 images · non-contrast
Comparison: 02/28/2015

ADDENDUM:
This patient had an abdomen CT performed on 03/01/2017 which was not
initially reviewed during the interpretation of the exam. Reviewing
the abdomen CT from 03/01/2017, this again confirms the noted
findings of subtle "stranding" in the retroperitoneal space along
the right UPJ and right ureter as well as some subtle
hyperenhancement in the wall of the proximal right ureter. The
inferior aspect of these changes were noted on the CT pelvis report
below but the stranding is not extend down to the distal ureter or
UVJ. Imaging appearance is relatively stable where there is overlap
of anatomy on these 2 exams about a week apart. No etiology for this
apparent edema/inflammation in the right retroperitoneum along the
proximal right ureter is evident. Specifically, there is no evidence
for ureteral stone, mass lesion or lymphadenopathy. As such,
infection would be a consideration. Wall thickening in the ureter is
not as prominent as typically seen in the setting of urothelial
neoplasm

I discussed these findings by telephone with Dr. Fineboy on the
morning 03/11/2017.
CLINICAL DATA: Nausea and vomiting with pelvic pain. History of
prostate cancer.
EXAM:
CT PELVIS WITHOUT CONTRAST
TECHNIQUE: Multidetector CT imaging of the pelvis was performed following the
standard protocol without intravenous contrast.

[Series 2: routine pelvis w/(date) · axial · 0.81mm/px · z∈[-468,-208]mm · 14 of 58 slices shown, 18 images]
[im 4/58  soft-tissue]
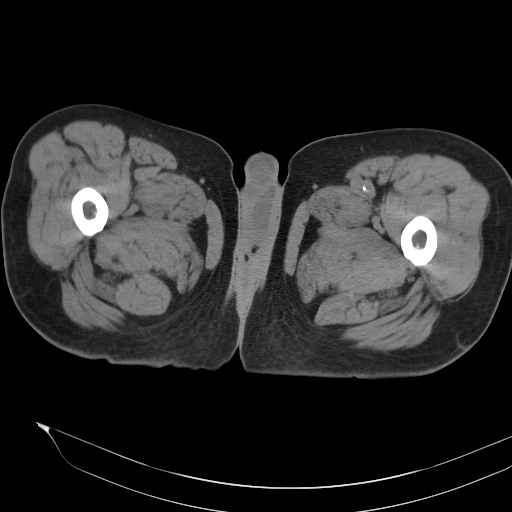
[im 4/58  bone]
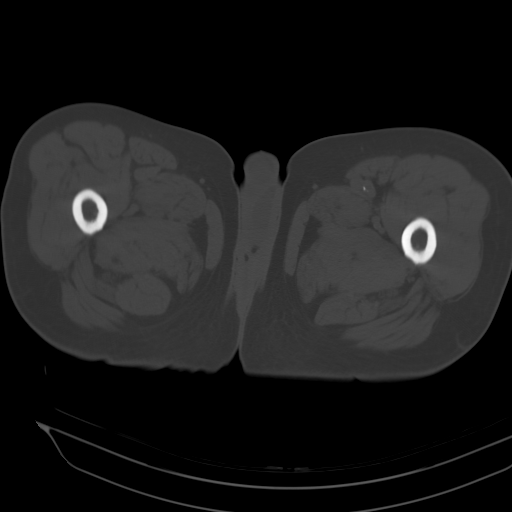
[im 8/58  soft-tissue]
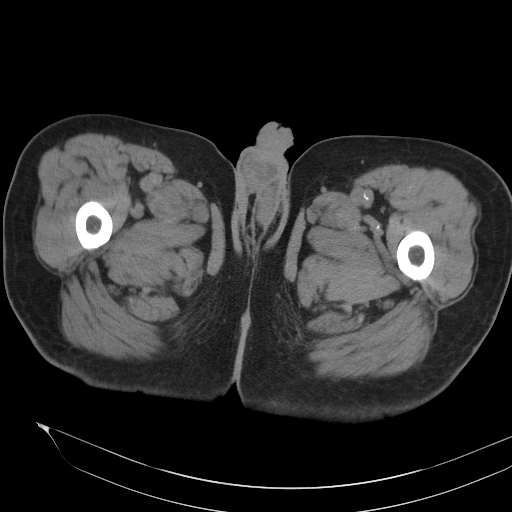
[im 13/58  soft-tissue]
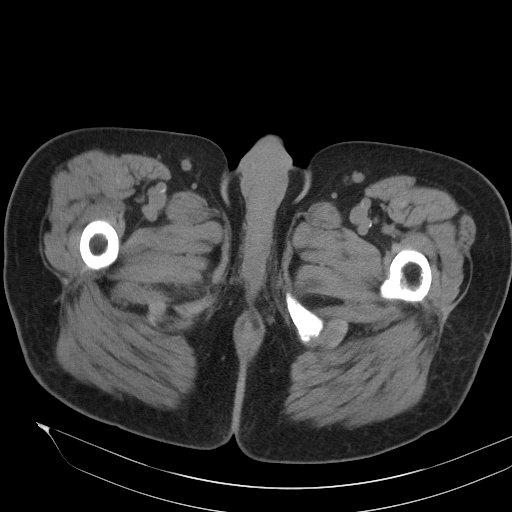
[im 17/58  soft-tissue]
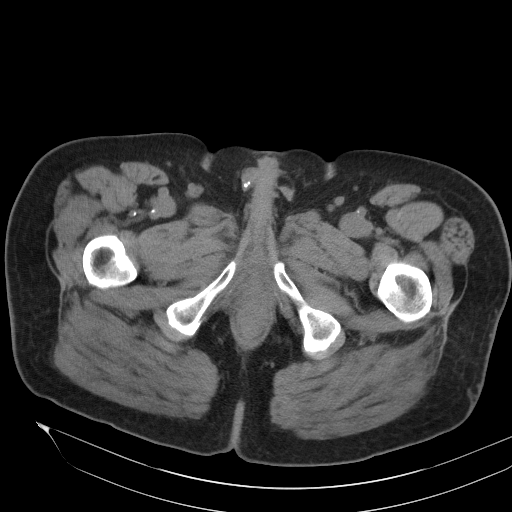
[im 23/58  soft-tissue]
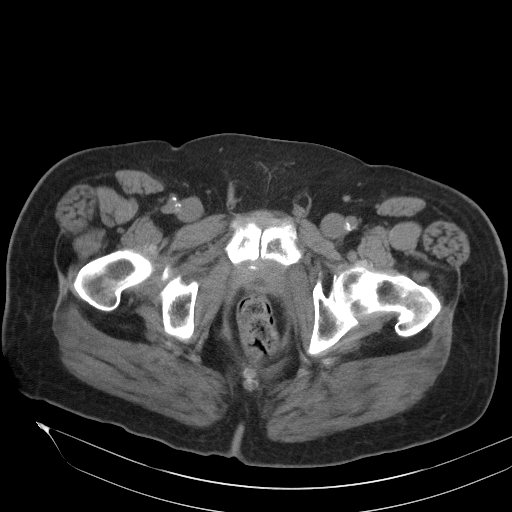
[im 26/58  soft-tissue]
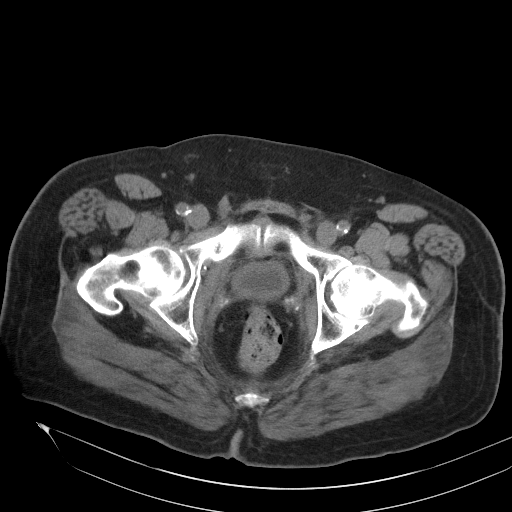
[im 32/58  soft-tissue]
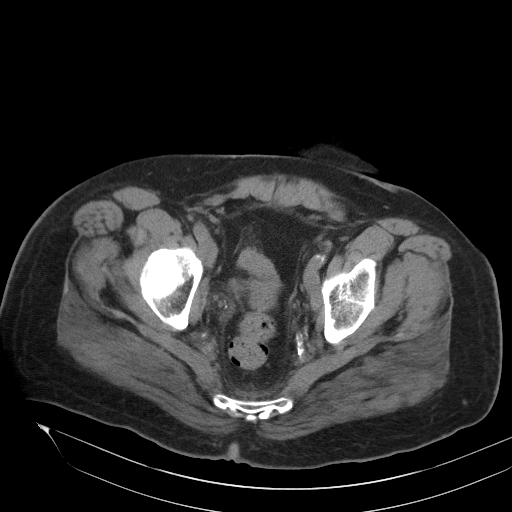
[im 35/58  soft-tissue]
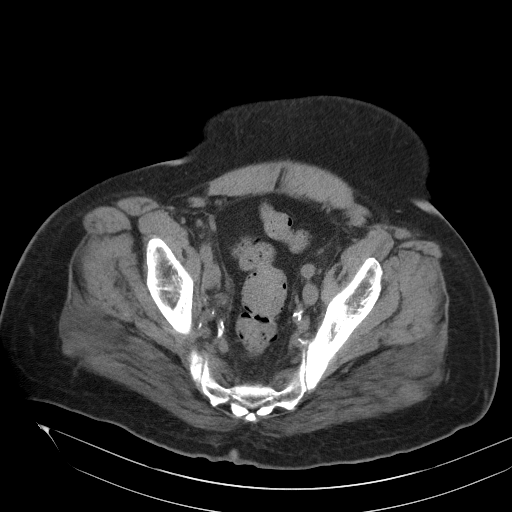
[im 41/58  soft-tissue]
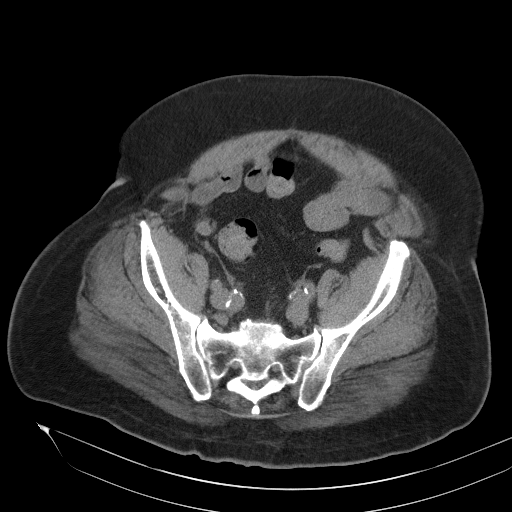
[im 41/58  bone]
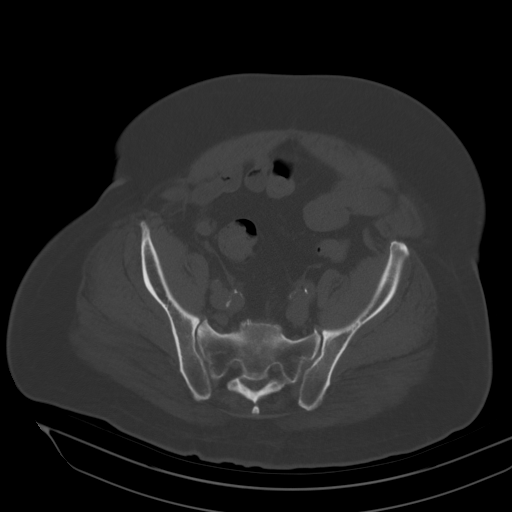
[im 45/58  soft-tissue]
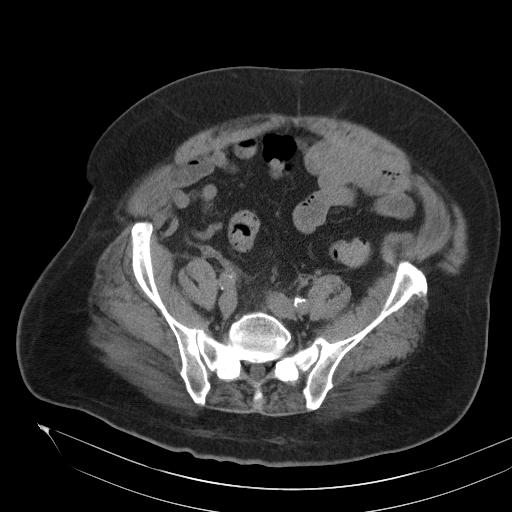
[im 50/58  soft-tissue]
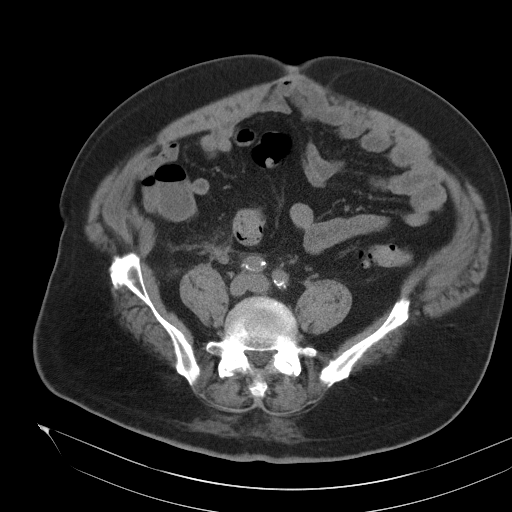
[im 50/58  lung]
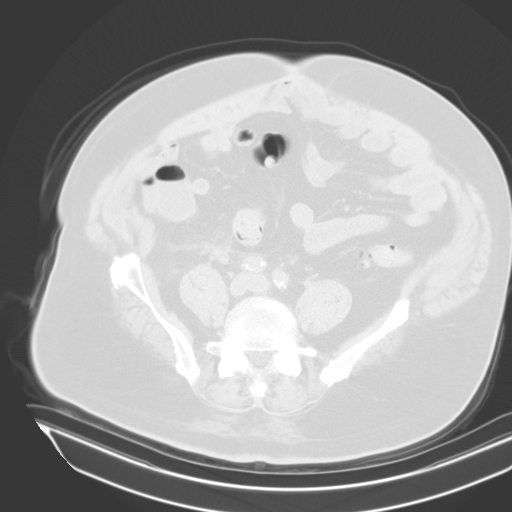
[im 52/58  lung]
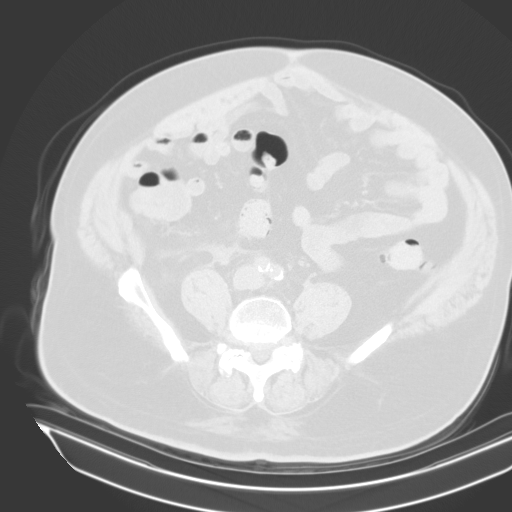
[im 54/58  soft-tissue]
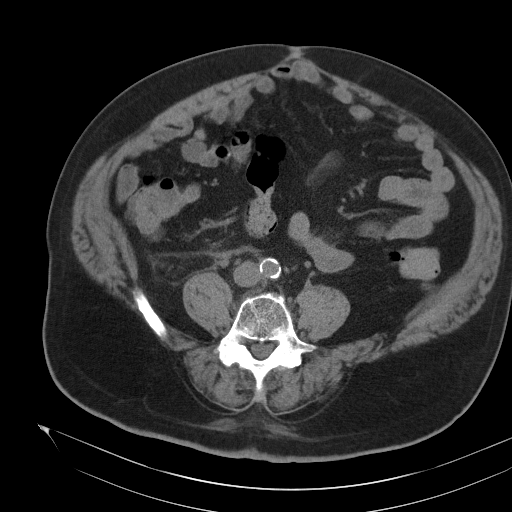
[im 54/58  lung]
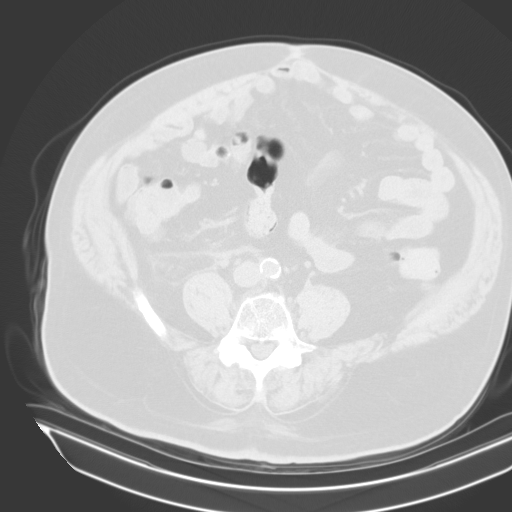
[im 56/58  lung]
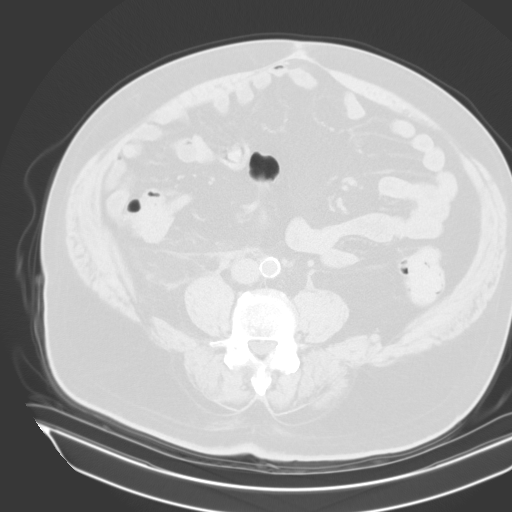

[14 of 32 positions shown; findings below may reference images not displayed]

FINDINGS: Urinary Tract: Urinary bladder decompressed, likely accounting for
the apparent mild circumferential wall thickening

Bowel: No colonic or small bowel dilatation within the visualized
pelvis.

Vascular/Lymphatic: Atherosclerotic calcification noted distal aorta
and iliac arteries. No abdominal lymphadenopathy

Reproductive:  Prostate gland surgically absent.

Other: Retroperitoneal edema identified in the right pelvis,
tracking along the course of the right ureter.

Musculoskeletal: Bone windows reveal no worrisome lytic or sclerotic
osseous lesions.
IMPRESSION: 1. Edema/inflammation identified in the extraperitoneal soft tissues
of the right upper pelvis. Features may be related to the right
kidney. Urinary obstruction, infection, or neoplasm would be
considerations. Consider CT imaging of the abdomen and pelvis to
further evaluate.
2. Status post prostatectomy.

These results will be called to the ordering clinician or
representative by the Radiologist Assistant, and communication
documented in the PACS or zVision Dashboard.

## 2019-04-14 DIAGNOSIS — E785 Hyperlipidemia, unspecified: Secondary | ICD-10-CM | POA: Insufficient documentation

## 2019-04-14 DIAGNOSIS — I10 Essential (primary) hypertension: Secondary | ICD-10-CM | POA: Insufficient documentation

## 2019-04-14 DIAGNOSIS — E1169 Type 2 diabetes mellitus with other specified complication: Secondary | ICD-10-CM | POA: Insufficient documentation

## 2019-04-14 DIAGNOSIS — M109 Gout, unspecified: Secondary | ICD-10-CM | POA: Insufficient documentation

## 2019-04-15 DIAGNOSIS — R809 Proteinuria, unspecified: Secondary | ICD-10-CM | POA: Insufficient documentation

## 2019-11-07 ENCOUNTER — Ambulatory Visit: Payer: Self-pay | Attending: Internal Medicine

## 2019-11-07 DIAGNOSIS — Z23 Encounter for immunization: Secondary | ICD-10-CM

## 2020-03-14 ENCOUNTER — Ambulatory Visit (INDEPENDENT_AMBULATORY_CARE_PROVIDER_SITE_OTHER): Payer: Medicare PPO | Admitting: Family Medicine

## 2020-03-14 ENCOUNTER — Other Ambulatory Visit: Payer: Self-pay

## 2020-03-14 ENCOUNTER — Encounter (INDEPENDENT_AMBULATORY_CARE_PROVIDER_SITE_OTHER): Payer: Self-pay | Admitting: Family Medicine

## 2020-03-14 VITALS — BP 150/70 | HR 85 | Temp 98.1°F | Ht 65.0 in | Wt 229.0 lb

## 2020-03-14 DIAGNOSIS — E1169 Type 2 diabetes mellitus with other specified complication: Secondary | ICD-10-CM

## 2020-03-14 DIAGNOSIS — Z6838 Body mass index (BMI) 38.0-38.9, adult: Secondary | ICD-10-CM

## 2020-03-14 DIAGNOSIS — R0602 Shortness of breath: Secondary | ICD-10-CM

## 2020-03-14 DIAGNOSIS — R5383 Other fatigue: Secondary | ICD-10-CM | POA: Diagnosis not present

## 2020-03-14 DIAGNOSIS — E785 Hyperlipidemia, unspecified: Secondary | ICD-10-CM

## 2020-03-14 DIAGNOSIS — I152 Hypertension secondary to endocrine disorders: Secondary | ICD-10-CM

## 2020-03-14 DIAGNOSIS — E1159 Type 2 diabetes mellitus with other circulatory complications: Secondary | ICD-10-CM | POA: Diagnosis not present

## 2020-03-14 DIAGNOSIS — Z1331 Encounter for screening for depression: Secondary | ICD-10-CM

## 2020-03-14 DIAGNOSIS — Z0289 Encounter for other administrative examinations: Secondary | ICD-10-CM

## 2020-03-14 DIAGNOSIS — E559 Vitamin D deficiency, unspecified: Secondary | ICD-10-CM

## 2020-03-14 DIAGNOSIS — E118 Type 2 diabetes mellitus with unspecified complications: Secondary | ICD-10-CM

## 2020-03-15 LAB — COMPREHENSIVE METABOLIC PANEL
ALT: 13 IU/L (ref 0–44)
AST: 16 IU/L (ref 0–40)
Albumin/Globulin Ratio: 1.8 (ref 1.2–2.2)
Albumin: 4.4 g/dL (ref 3.7–4.7)
Alkaline Phosphatase: 64 IU/L (ref 44–121)
BUN/Creatinine Ratio: 16 (ref 10–24)
BUN: 16 mg/dL (ref 8–27)
Bilirubin Total: 0.3 mg/dL (ref 0.0–1.2)
CO2: 20 mmol/L (ref 20–29)
Calcium: 9.6 mg/dL (ref 8.6–10.2)
Chloride: 105 mmol/L (ref 96–106)
Creatinine, Ser: 1.03 mg/dL (ref 0.76–1.27)
GFR calc Af Amer: 84 mL/min/{1.73_m2} (ref >59)
GFR calc non Af Amer: 72 mL/min/{1.73_m2} (ref >59)
Globulin, Total: 2.4 g/dL (ref 1.5–4.5)
Glucose: 126 mg/dL — ABNORMAL HIGH (ref 65–99)
Potassium: 4.7 mmol/L (ref 3.5–5.2)
Sodium: 140 mmol/L (ref 134–144)
Total Protein: 6.8 g/dL (ref 6.0–8.5)

## 2020-03-15 LAB — INSULIN, RANDOM: INSULIN: 16.5 u[IU]/mL (ref 2.6–24.9)

## 2020-03-15 LAB — VITAMIN D 25 HYDROXY (VIT D DEFICIENCY, FRACTURES): Vit D, 25-Hydroxy: 13.1 ng/mL — ABNORMAL LOW (ref 30.0–100.0)

## 2020-03-15 LAB — T3: T3, Total: 107 ng/dL (ref 71–180)

## 2020-03-15 LAB — T4: T4, Total: 5.6 ug/dL (ref 4.5–12.0)

## 2020-03-15 NOTE — Progress Notes (Signed)
Dear Andrew Ho,   Thank you for referring Andrew Ho to our clinic. The following note includes my evaluation and treatment recommendations.  Chief Complaint:   OBESITY Andrew Ho (MR# [id number]) is a 75 y.o. male who presents for evaluation and treatment of obesity and related comorbidities. Current BMI is Body mass index is 38.11 kg/m. Andrew Ho has been struggling with his weight for many years and has been unsuccessful in either losing weight, maintaining weight loss, or reaching his healthy weight goal.  Andrew Ho is currently in the action stage of change and ready to dedicate time achieving and maintaining a healthier weight. Andrew Ho is interested in becoming our patient and working on intensive lifestyle modifications including (but not limited to) diet and exercise for weight loss.  Andrew Ho was referred by Andrew Ho. He occasionally skips breakfast. His first meal is early afternoon which is 1-2 glasses of water with 1 banana or apple. He may go out for lunch- meat with vegetables (may be a few beef patties or [place]) and cornbread (feel full)- may eat 1 orange or banana later. He is only eating 1 meal a day.  Andrew Ho's habits were reviewed today and are as follows: His family eats meals together, he thinks his family will eat healthier with him, his desired weight loss is 29 lbs, he started gaining weight after retirement in 2019, his heaviest weight ever was 256 pounds, he is a picky eater and doesn't like to eat healthier foods, he skips meals frequently, he is frequently drinking liquids with calories and he struggles with emotional eating.  Depression Screen Andrew Ho Food and Mood (modified PHQ-9) score was 0.  Depression screen [facility] 2/9 03/14/2020  Decreased Interest 0  Down, Depressed, Hopeless 0  PHQ - 2 Score 0  Altered sleeping 0  Tired, decreased energy 0  Change in appetite 0  Feeling bad or failure about yourself  0  Trouble concentrating 0  Moving slowly  or fidgety/restless 0  Suicidal thoughts 0  PHQ-9 Score 0  Difficult doing work/chores Not difficult at all   Subjective:   1. Other fatigue Andrew Ho admits to daytime somnolence and denies waking up still tired. Patent has a history of symptoms of daytime fatigue. Andrew Ho generally gets 7.5-8.5 hours of sleep per night, and states that he has generally restful sleep. Snoring is present. Apneic episodes are not present. Epworth Sleepiness Score is 6. EKG- sinus bradycardia at 54 bpm. His last TSH 2.28 on 12/21/2019.  2. SOB (shortness of breath) on exertion Andrew Ho notes increasing shortness of breath with exercising and seems to be worsening over time with weight gain. He notes getting out of breath sooner with activity than he used to. This has not gotten worse recently. Andrew Ho denies shortness of breath at rest or orthopnea. EKG- sinus bradycardia at 54 bpm. His last TSH 2.28 on 12/21/2019.  3. Hypertension associated with diabetes (Andrew Ho) Andrew Ho is on atenolol, doxazosin, and losartan. BP elevated today. He hasn't taken any meds today. He was diagnosed 10 years ago and is managed by Andrew Ho.  4. Controlled type 2 diabetes mellitus with complication, without long-term current use of insulin (HCC) Andrew Ho is on Metformin. His last A1c was 7.0. he was diagnosed in 10/2014. He denies GI side effects on Metformin.  5. Vitamin D deficiency Andrew Ho is likely Vit D deficient due to obesity. He reports fatigue.  6. Hyperlipidemia associated with type 2 diabetes mellitus (Andrew Ho) Andrew Ho's last LDL 109+, HDL 47, triglycerides  171 on 12/21/2019. He is on atorvastatin.  Assessment/Plan:   1. Other fatigue Andrew Ho does not feel that his weight is causing his energy to be lower than it should be. Fatigue may be related to obesity, depression or many other causes. Labs will be ordered, and in the meanwhile, Andrew Ho will focus on self care including making healthy food choices, increasing physical activity and  focusing on stress reduction. Check labs today.  - EKG 12-Lead - T3 - T4  2. SOB (shortness of breath) on exertion Andrew Ho does not feel that he gets out of breath more easily that he used to when he exercises. Andrew Ho's shortness of breath appears to be obesity related and exercise induced. He has agreed to work on weight loss and gradually increase exercise to treat his exercise induced shortness of breath. Will continue to monitor closely.  3. Hypertension associated with diabetes (Andrew Ho) Andrew Ho is working on healthy weight loss and exercise to improve blood pressure control. We will watch for signs of hypotension as he continues his lifestyle modifications. Check labs today.  - Comprehensive metabolic panel  4. Controlled type 2 diabetes mellitus with complication, without long-term current use of insulin (HCC) Andrew Ho control is important to decrease the likelihood of diabetic complications such as nephropathy, neuropathy, limb loss, blindness, coronary artery disease, and death. Intensive lifestyle modification including diet, exercise and weight loss are the first line of treatment for diabetes. Check labs today.  - Insulin, random  5. Vitamin D deficiency Low Vitamin D level contributes to fatigue and are associated with obesity, breast, and colon cancer. He agrees to follow-up for routine testing of Vitamin D, at least 2-3 times per year to avoid over-replacement. Check labs today.  - VITAMIN D 25 Hydroxy (Vit-D Deficiency, Fractures)  6. Hyperlipidemia associated with type 2 diabetes mellitus (Andrew Ho) Cardiovascular risk and specific lipid/LDL goals reviewed.  We discussed several lifestyle modifications today and Andrew Ho will continue to work on diet, exercise and weight loss efforts. Orders and follow up as documented in patient record. Repeat labs in 3 months.  Counseling Intensive lifestyle modifications are the first line treatment for this issue. . Dietary changes:  Increase soluble fiber. Decrease simple carbohydrates. . Exercise changes: Moderate to vigorous-intensity aerobic activity 150 minutes per week if tolerated. . Lipid-lowering medications: see documented in medical record.   7. Depression screening Andrew Ho had a negative depression screening. Depression is commonly associated with obesity and often results in emotional eating behaviors. We will monitor this closely and work on CBT to help improve the non-hunger eating patterns. Referral to Psychology may be required if no improvement is seen as he continues in our clinic.  8. Class 2 severe obesity with serious comorbidity and body mass index (BMI) of 38.0 to 38.9 in adult, unspecified obesity type Andrew Ho) Andrew Ho is currently in the action stage of change and his goal is to continue with weight loss efforts. I recommend Andrew Ho begin the structured treatment plan as follows:  He has agreed to the Category 3 Plan with up to 350 microwave meals.  Exercise goals: No exercise has been prescribed at this time.   Behavioral modification strategies: increasing lean protein intake, no skipping meals, meal planning and cooking strategies, keeping healthy foods in the home and planning for success.  He was informed of the importance of frequent follow-up visits to maximize his success with intensive lifestyle modifications for his multiple health conditions. He was informed we would discuss his lab results at his next  visit unless there is a critical issue that needs to be addressed sooner. Andrew Ho agreed to keep his next visit at the agreed upon time to discuss these results.  Objective:   Blood pressure (!) 150/70, pulse 85, temperature 98.1 F (36.7 C), temperature source Oral, height 5\' 5"  (1.651 m), weight 229 lb (103.9 kg), SpO2 99 %. Body mass index is 38.11 kg/m.  EKG: Normal sinus rhythm, rate 56.  Indirect Calorimeter completed today shows a VO2 of 314 and a REE of 2184.  His calculated basal  metabolic rate is 7062 thus his basal metabolic rate is better than expected.  General: Cooperative, alert, well developed, in no acute distress. HEENT: Conjunctivae and lids unremarkable. Cardiovascular: Regular rhythm.  Lungs: Normal work of breathing. Neurologic: No focal deficits.   Lab Results  Component Value Date   CREATININE 1.03 03/14/2020   BUN 16 03/14/2020   NA 140 03/14/2020   K 4.7 03/14/2020   CL 105 03/14/2020   CO2 20 03/14/2020   Lab Results  Component Value Date   ALT 13 03/14/2020   AST 16 03/14/2020   ALKPHOS 64 03/14/2020   BILITOT 0.3 03/14/2020   Lab Results  Component Value Date   HGBA1C 6.8 (H) 04/11/2015   Lab Results  Component Value Date   INSULIN 16.5 03/14/2020   No results found for: TSH No results found for: CHOL, HDL, LDLCALC, LDLDIRECT, TRIG, CHOLHDL Lab Results  Component Value Date   WBC 8.3 04/11/2015   HGB 12.5 (L) 04/19/2015   HCT 38.1 (L) 04/19/2015   MCV 85.3 04/11/2015   PLT 225 04/11/2015   No results found for: IRON, TIBC, FERRITIN Obesity Behavioral Intervention:   Approximately 15 minutes were spent on the discussion below.  ASK: We discussed the diagnosis of obesity with Andrew Ho today and Saamir agreed to give [place] permission to discuss obesity behavioral modification therapy today.  ASSESS: Oden has the diagnosis of obesity and his BMI today is 38.2. Arleigh is in the action stage of change.   ADVISE: Pasqual was educated on the multiple health risks of obesity as well as the benefit of weight loss to improve his health. He was advised of the need for long term treatment and the importance of lifestyle modifications to improve his current health and to decrease his risk of future health problems.  AGREE: Multiple dietary modification options and treatment options were discussed and Jasani agreed to follow the recommendations documented in the above note.  ARRANGE: Ibraheem was educated on the importance of  frequent visits to treat obesity as outlined per CMS and USPSTF guidelines and agreed to schedule his next follow up appointment today.  Attestation Statements:   Reviewed by clinician on day of visit: allergies, medications, problem list, medical history, surgical history, family history, social history, and previous encounter notes.  Coral Ceo, am acting as transcriptionist for Coralie Common, MD.   This is the patient's first visit at [facility]. The patient's NEW PATIENT PACKET was reviewed at length. Included in the packet: current and past health history, medications, allergies, ROS, gynecologic history (women only), surgical history, family history, social history, weight history, weight loss surgery history (for those that have had weight loss surgery), nutritional evaluation, mood and food questionnaire, PHQ9, Epworth questionnaire, sleep habits questionnaire, patient life and health improvement goals questionnaire. These will all be scanned into the patient's chart under media.   During the visit, I independently reviewed the patient's EKG, bioimpedance scale results, and  indirect calorimeter results. I used this information to tailor a meal plan for the patient that will help him to lose weight and will improve his obesity-related conditions going forward. I performed a medically necessary appropriate examination and/or evaluation. I discussed the assessment and treatment plan with the patient. The patient was provided an opportunity to ask questions and all were answered. The patient agreed with the plan and demonstrated an understanding of the instructions. Labs were ordered at this visit and will be reviewed at the next visit unless more critical results need to be addressed immediately. Clinical information was updated and documented in the EMR.   Time spent on visit including pre-visit chart review and post-visit care was 45 minutes.   A separate 15 minutes  was spent on risk counseling (see above).  I have reviewed the above documentation for accuracy and completeness, and I agree with the above. - Jinny Blossom, MD

## 2020-03-28 ENCOUNTER — Other Ambulatory Visit (INDEPENDENT_AMBULATORY_CARE_PROVIDER_SITE_OTHER): Payer: Self-pay | Admitting: Family Medicine

## 2020-03-28 ENCOUNTER — Ambulatory Visit (INDEPENDENT_AMBULATORY_CARE_PROVIDER_SITE_OTHER): Payer: Medicare PPO | Admitting: Family Medicine

## 2020-03-28 ENCOUNTER — Encounter (INDEPENDENT_AMBULATORY_CARE_PROVIDER_SITE_OTHER): Payer: Self-pay | Admitting: Family Medicine

## 2020-03-28 ENCOUNTER — Other Ambulatory Visit: Payer: Self-pay

## 2020-03-28 VITALS — BP 120/58 | HR 60 | Temp 98.6°F | Ht 65.0 in | Wt 227.0 lb

## 2020-03-28 DIAGNOSIS — E785 Hyperlipidemia, unspecified: Secondary | ICD-10-CM

## 2020-03-28 DIAGNOSIS — E559 Vitamin D deficiency, unspecified: Secondary | ICD-10-CM

## 2020-03-28 DIAGNOSIS — E1165 Type 2 diabetes mellitus with hyperglycemia: Secondary | ICD-10-CM | POA: Diagnosis not present

## 2020-03-28 DIAGNOSIS — E1169 Type 2 diabetes mellitus with other specified complication: Secondary | ICD-10-CM

## 2020-03-28 DIAGNOSIS — Z6837 Body mass index (BMI) 37.0-37.9, adult: Secondary | ICD-10-CM | POA: Diagnosis not present

## 2020-03-28 DIAGNOSIS — I152 Hypertension secondary to endocrine disorders: Secondary | ICD-10-CM

## 2020-03-28 DIAGNOSIS — E1159 Type 2 diabetes mellitus with other circulatory complications: Secondary | ICD-10-CM | POA: Diagnosis not present

## 2020-03-28 MED ORDER — VITAMIN D (ERGOCALCIFEROL) 1.25 MG (50000 UNIT) PO CAPS: 50000.0000 [IU] | ORAL_CAPSULE | ORAL | 0 refills | Status: DC

## 2020-03-30 NOTE — Progress Notes (Signed)
Chief Complaint:   OBESITY Andrew Ho is here to discuss his progress with his obesity treatment plan along with follow-up of his obesity related diagnoses. Andrew Ho is on the Category 3 Plan and states he is following his eating plan approximately 50% of the time. Andrew Ho states he is walking and going to the gym 60 minutes 3-3 times per week.  Today's visit was #: 2 Starting weight: 229 lbs Starting date: 03/14/2020 Today's weight: 227 lbs Today's date: 03/28/2020 Total lbs lost to date: 2 lbs Total lbs lost since last in-office visit: 2 lbs  Interim History: Andrew Ho is currently looking for tickets to [facility] tournament. He states sometimes his wife is not considering meal plan when preparing dinner. He doesn't like cottage cheese or [place] yogurt. He is doing the egg option at breakfast around 12:30-1 PM as his first meal of the day, and then he may eat vegetables and baked chicken for dinner. He did have Frosted Flakes for breakfast yesterday.  Subjective:   1. Vitamin D deficiency New. Discussed labs with patient today. Andrew Ho's Vit D level is 13.1. He reports fatigue. He is not on Vit D supplementation.  2. Type 2 diabetes mellitus with hyperglycemia, without long-term current use of insulin ([place]) Discussed labs with patient today. Andrew Ho's last A1c 7.0 with an insulin level of 16.5. He is on Metformin daily. He still has occasional carb cravings for both sweets and starches.   Lab Results  Component Value Date   HGBA1C 6.8 (H) 04/11/2015   Lab Results  Component Value Date   CREATININE 1.03 03/14/2020   Lab Results  Component Value Date   INSULIN 16.5 03/14/2020    3. Hyperlipidemia associated with type 2 diabetes mellitus ([place]) Discussed labs with patient today. Andrew Ho is on Lipitor with no myalgias or transaminitis. His 11/2019 LDL previously was 109, HDL 47, and triglycerides 171.  4. Hypertension associated with diabetes ([place]) Andrew Ho's BP is well controlled today.  He  denies chest pain, chest pressure and headache.  BP Readings from Last 3 Encounters:  03/28/20 (!) 120/58  03/14/20 (!) 150/70  12/06/15 119/63    Assessment/Plan:   1. Vitamin D deficiency Low Vitamin D level contributes to fatigue and are associated with obesity, breast, and colon cancer. He agrees to start to take prescription Vitamin D @50 ,000 IU every week and will follow-up for routine testing of Vitamin D, at least 2-3 times per year to avoid over-replacement.  - Vitamin D, Ergocalciferol, (DRISDOL) 1.25 MG (50000 UNIT) CAPS capsule; Take 1 capsule (50,000 Units total) by mouth every 7 (seven) days.  Dispense: 4 capsule; Refill: 0  2. Type 2 diabetes mellitus with hyperglycemia, without long-term current use of insulin (HCC) Good blood sugar control is important to decrease the likelihood of diabetic complications such as nephropathy, neuropathy, limb loss, blindness, coronary artery disease, and death. Intensive lifestyle modification including diet, exercise and weight loss are the first line of treatment for diabetes. Follow up labs in 3 months. Continue Metformin.  3. Hyperlipidemia associated with type 2 diabetes mellitus ([place]) Cardiovascular risk and specific lipid/LDL goals reviewed.  We discussed several lifestyle modifications today and Andrew Ho will continue to work on diet, exercise and weight loss efforts. Orders and follow up as documented in patient record. Repeat labs in 3 months to see change with meal plan compliance.  Counseling Intensive lifestyle modifications are the first line treatment for this issue. . Dietary changes: Increase soluble fiber. Decrease simple carbohydrates. . Exercise changes: Moderate  to vigorous-intensity aerobic activity 150 minutes per week if tolerated. . Lipid-lowering medications: see documented in medical record.  4. Hypertension associated with diabetes ([place]) Andrew Ho is working on healthy weight loss and exercise to improve blood  pressure control. We will watch for signs of hypotension as he continues his lifestyle modifications. Follow up BP next appointment. If BP is still controlled, we will decrease 1 of pt's BP medications.  5. Class 2 severe obesity with serious comorbidity and body mass index (BMI) of 37.0 to 37.9 in adult, unspecified obesity type [facility]) Andrew Ho is currently in the action stage of change. As such, his goal is to continue with weight loss efforts. He has agreed to the Category 3 Plan.   Exercise goals: No exercise has been prescribed at this time.  Behavioral modification strategies: increasing lean protein intake, meal planning and cooking strategies, keeping healthy foods in the home and planning for success.  Andrew Ho has agreed to follow-up with our clinic in 2 weeks. He was informed of the importance of frequent follow-up visits to maximize his success with intensive lifestyle modifications for his multiple health conditions.   Objective:   Blood pressure (!) 120/58, pulse 60, temperature 98.6 F (37 C), temperature source Oral, height 5\' 5"  (1.651 m), weight 227 lb (103 kg), SpO2 99 %. Body mass index is 37.77 kg/m.  General: Cooperative, alert, well developed, in no acute distress. HEENT: Conjunctivae and lids unremarkable. Cardiovascular: Regular rhythm.  Lungs: Normal work of breathing. Neurologic: No focal deficits.   Lab Results  Component Value Date   CREATININE 1.03 03/14/2020   BUN 16 03/14/2020   NA 140 03/14/2020   K 4.7 03/14/2020   CL 105 03/14/2020   CO2 20 03/14/2020   Lab Results  Component Value Date   ALT 13 03/14/2020   AST 16 03/14/2020   ALKPHOS 64 03/14/2020   BILITOT 0.3 03/14/2020   Lab Results  Component Value Date   HGBA1C 6.8 (H) 04/11/2015   Lab Results  Component Value Date   INSULIN 16.5 03/14/2020   No results found for: TSH No results found for: CHOL, HDL, LDLCALC, LDLDIRECT, TRIG, CHOLHDL Lab Results  Component Value Date   WBC 8.3  04/11/2015   HGB 12.5 (L) 04/19/2015   HCT 38.1 (L) 04/19/2015   MCV 85.3 04/11/2015   PLT 225 04/11/2015   No results found for: IRON, TIBC, FERRITIN  Obesity Behavioral Intervention:   Approximately 15 minutes were spent on the discussion below.  ASK: We discussed the diagnosis of obesity with Andrew Ho today and Andrew Ho agreed to give [place] permission to discuss obesity behavioral modification therapy today.  ASSESS: Andrew Ho has the diagnosis of obesity and his BMI today is 37.8. Andrew Ho is in the action stage of change.   ADVISE: Andrew Ho was educated on the multiple health risks of obesity as well as the benefit of weight loss to improve his health. He was advised of the need for long term treatment and the importance of lifestyle modifications to improve his current health and to decrease his risk of future health problems.  AGREE: Multiple dietary modification options and treatment options were discussed and Andrew Ho agreed to follow the recommendations documented in the above note.  ARRANGE: Andrew Ho was educated on the importance of frequent visits to treat obesity as outlined per CMS and USPSTF guidelines and agreed to schedule his next follow up appointment today.  Attestation Statements:   Reviewed by clinician on day of visit: allergies, medications, problem list, medical  history, surgical history, family history, social history, and previous encounter notes.  Coral Ceo, am acting as transcriptionist for Coralie Common, MD.   I have reviewed the above documentation for accuracy and completeness, and I agree with the above. - Jinny Blossom, MD

## 2020-04-11 ENCOUNTER — Other Ambulatory Visit: Payer: Self-pay

## 2020-04-11 ENCOUNTER — Ambulatory Visit (INDEPENDENT_AMBULATORY_CARE_PROVIDER_SITE_OTHER): Payer: Medicare PPO | Admitting: Physician Assistant

## 2020-04-11 VITALS — BP 131/70 | HR 62 | Temp 97.8°F | Ht 65.0 in | Wt 227.0 lb

## 2020-04-11 DIAGNOSIS — E1165 Type 2 diabetes mellitus with hyperglycemia: Secondary | ICD-10-CM | POA: Diagnosis not present

## 2020-04-11 DIAGNOSIS — E559 Vitamin D deficiency, unspecified: Secondary | ICD-10-CM

## 2020-04-11 DIAGNOSIS — Z6837 Body mass index (BMI) 37.0-37.9, adult: Secondary | ICD-10-CM

## 2020-04-11 MED ORDER — VITAMIN D (ERGOCALCIFEROL) 1.25 MG (50000 UNIT) PO CAPS: 50000.0000 [IU] | ORAL_CAPSULE | ORAL | 0 refills | Status: DC

## 2020-04-14 NOTE — Progress Notes (Signed)
Chief Complaint:   OBESITY Andrew Ho is here to discuss his progress with his obesity treatment plan along with follow-up of his obesity related diagnoses. Sevin is on the Category 3 Plan and states he is following his eating plan approximately 65% of the time. Jaimin states he is walking and at the gym for 45-60 minutes 3-4 times per week.  Today's visit was #: 3 Starting weight: 229 lbs Starting date: 03/14/2020 Today's weight: 227 lbs Today's date: 04/11/2020 Total lbs lost to date: 2 Total lbs lost since last in-office visit: 0  Interim History: Taylan is only eating 1-2 meals daily due to lack of hunger. He goes to work at 7 am for [employer]", but he is not hungry. He starts eating at 1 pm, whatever his wife makes him and may or may not eat another meal.  Subjective:   1. Vitamin D deficiency Augusten is on Vit D, and he is tolerating it well.  2. Type 2 diabetes mellitus with hyperglycemia, without long-term current use of insulin (HCC) Cody is on metformin. Last A1c was 7.0 per the patient. He denies polyphagia. He checks his blood sugars every other day.  Assessment/Plan:   1. Vitamin D deficiency Low Vitamin D level contributes to fatigue and are associated with obesity, breast, and colon cancer. We will refill prescription Vitamin D for 1 month. Alexius will follow-up for routine testing of Vitamin D, at least 2-3 times per year to avoid over-replacement.  - Vitamin D, Ergocalciferol, (DRISDOL) 1.25 MG (50000 UNIT) CAPS capsule; Take 1 capsule (50,000 Units total) by mouth every 7 (seven) days.  Dispense: 4 capsule; Refill: 0  2. Type 2 diabetes mellitus with hyperglycemia, without long-term current use of insulin (HCC) Good blood sugar control is important to decrease the likelihood of diabetic complications such as nephropathy, neuropathy, limb loss, blindness, coronary artery disease, and death. Intensive lifestyle modification including diet, exercise and weight  loss are the first line of treatment for diabetes. Sarath will follow up with his primary care physician, and will continue his medications and weight loss.  3. Class 2 severe obesity with serious comorbidity and body mass index (BMI) of 37.0 to 37.9 in adult, unspecified obesity type [facility]) Avid is currently in the action stage of change. As such, his goal is to continue with weight loss efforts. He has agreed to the Category 3 Plan.   Exercise goals: As is.  Behavioral modification strategies: no skipping meals, meal planning and cooking strategies and keeping healthy foods in the home.  Juancarlos has agreed to follow-up with our clinic in 2 weeks. He was informed of the importance of frequent follow-up visits to maximize his success with intensive lifestyle modifications for his multiple health conditions.   Objective:   Blood pressure 131/70, pulse 62, temperature 97.8 F (36.6 C), height 5\' 5"  (1.651 m), weight 227 lb (103 kg), SpO2 97 %. Body mass index is 37.77 kg/m.  General: Cooperative, alert, well developed, in no acute distress. HEENT: Conjunctivae and lids unremarkable. Cardiovascular: Regular rhythm.  Lungs: Normal work of breathing. Neurologic: No focal deficits.   Lab Results  Component Value Date   CREATININE 1.03 03/14/2020   BUN 16 03/14/2020   NA 140 03/14/2020   K 4.7 03/14/2020   CL 105 03/14/2020   CO2 20 03/14/2020   Lab Results  Component Value Date   ALT 13 03/14/2020   AST 16 03/14/2020   ALKPHOS 64 03/14/2020   BILITOT 0.3 03/14/2020  Lab Results  Component Value Date   HGBA1C 6.8 (H) 04/11/2015   Lab Results  Component Value Date   INSULIN 16.5 03/14/2020   No results found for: TSH No results found for: CHOL, HDL, LDLCALC, LDLDIRECT, TRIG, CHOLHDL Lab Results  Component Value Date   WBC 8.3 04/11/2015   HGB 12.5 (L) 04/19/2015   HCT 38.1 (L) 04/19/2015   MCV 85.3 04/11/2015   PLT 225 04/11/2015   No results found for: IRON, TIBC,  FERRITIN  Obesity Behavioral Intervention:   Approximately 15 minutes were spent on the discussion below.  ASK: We discussed the diagnosis of obesity with [name] today and Otilio agreed to give [place] permission to discuss obesity behavioral modification therapy today.  ASSESS: Serenity has the diagnosis of obesity and his BMI today is 37.77. Kajuan is in the action stage of change.   ADVISE: Abeer was educated on the multiple health risks of obesity as well as the benefit of weight loss to improve his health. He was advised of the need for long term treatment and the importance of lifestyle modifications to improve his current health and to decrease his risk of future health problems.  AGREE: Multiple dietary modification options and treatment options were discussed and Jayshawn agreed to follow the recommendations documented in the above note.  ARRANGE: Krystopher was educated on the importance of frequent visits to treat obesity as outlined per CMS and USPSTF guidelines and agreed to schedule his next follow up appointment today.  Attestation Statements:   Reviewed by clinician on day of visit: allergies, medications, problem list, medical history, surgical history, family history, social history, and previous encounter notes.   Wilhemena Durie, am acting as transcriptionist for [employer], PA-C.  I have reviewed the above documentation for accuracy and completeness, and I agree with the above. Abby Potash, PA-C

## 2020-04-18 ENCOUNTER — Other Ambulatory Visit (INDEPENDENT_AMBULATORY_CARE_PROVIDER_SITE_OTHER): Payer: Self-pay | Admitting: Physician Assistant

## 2020-04-18 DIAGNOSIS — E559 Vitamin D deficiency, unspecified: Secondary | ICD-10-CM

## 2020-04-26 ENCOUNTER — Ambulatory Visit (INDEPENDENT_AMBULATORY_CARE_PROVIDER_SITE_OTHER): Payer: Medicare PPO | Admitting: Physician Assistant

## 2020-05-08 ENCOUNTER — Encounter (INDEPENDENT_AMBULATORY_CARE_PROVIDER_SITE_OTHER): Payer: Self-pay | Admitting: Bariatrics

## 2020-05-09 ENCOUNTER — Encounter (INDEPENDENT_AMBULATORY_CARE_PROVIDER_SITE_OTHER): Payer: Self-pay | Admitting: Bariatrics

## 2020-05-09 ENCOUNTER — Other Ambulatory Visit: Payer: Self-pay

## 2020-05-09 ENCOUNTER — Ambulatory Visit (INDEPENDENT_AMBULATORY_CARE_PROVIDER_SITE_OTHER): Payer: Medicare PPO | Admitting: Bariatrics

## 2020-05-09 VITALS — BP 143/71 | HR 63 | Temp 97.6°F | Ht 65.0 in | Wt 226.0 lb

## 2020-05-09 DIAGNOSIS — Z6837 Body mass index (BMI) 37.0-37.9, adult: Secondary | ICD-10-CM

## 2020-05-09 DIAGNOSIS — E559 Vitamin D deficiency, unspecified: Secondary | ICD-10-CM

## 2020-05-09 DIAGNOSIS — Z6838 Body mass index (BMI) 38.0-38.9, adult: Secondary | ICD-10-CM

## 2020-05-09 DIAGNOSIS — E1165 Type 2 diabetes mellitus with hyperglycemia: Secondary | ICD-10-CM

## 2020-05-10 ENCOUNTER — Encounter (INDEPENDENT_AMBULATORY_CARE_PROVIDER_SITE_OTHER): Payer: Self-pay | Admitting: Bariatrics

## 2020-05-10 DIAGNOSIS — E6609 Other obesity due to excess calories: Secondary | ICD-10-CM | POA: Insufficient documentation

## 2020-05-10 NOTE — Progress Notes (Signed)
Chief Complaint:   OBESITY Benen is here to discuss his progress with his obesity treatment plan along with follow-up of his obesity related diagnoses. Satish is on the Category 3 Plan and states he is following his eating plan approximately 75% of the time. Cortavious states he is going to the gym and walking for 1.5 - 2 hours 3-5 times per week.  Today's visit was #: 4 Starting weight: 229 lbs Starting date: 03/14/2020 Today's weight: 226 lbs Today's date: 05/09/2020 Total lbs lost to date: 3 lbs Total lbs lost since last in-office visit: 1 lb  Interim History: Krishav is down 1 pound since his last visit.  Marland Kitchen He is drinking more water.  Subjective:   1. Type 2 diabetes mellitus with hyperglycemia, without long-term current use of insulin ([place]) He is taking metformin.  Lab Results  Component Value Date   HGBA1C 6.8 (H) 04/11/2015   Lab Results  Component Value Date   CREATININE 1.03 03/14/2020   Lab Results  Component Value Date   INSULIN 16.5 03/14/2020   2. Vitamin D deficiency Damoni's Vitamin D level was 13.1 on 03/14/2020. He is currently taking prescription vitamin D 50,000 IU each week. He denies nausea, vomiting or muscle weakness.  Does not want to continue high dose vitamin D.  Assessment/Plan:   1. Type 2 diabetes mellitus with hyperglycemia, without long-term current use of insulin (HCC) Good blood sugar control is important to decrease the likelihood of diabetic complications such as nephropathy, neuropathy, limb loss, blindness, coronary artery disease, and death. Intensive lifestyle modification including diet, exercise and weight loss are the first line of treatment for diabetes.  Continue metformin.  2. Vitamin D deficiency Low Vitamin D level contributes to fatigue and are associated with obesity, breast, and colon cancer. He will buy and take OTC vitamin D 5,000 or 10,000 IU daily.  3. Obesity, current BMI 37  Sy is currently in the action stage  of change. As such, his goal is to continue with weight loss efforts. He has agreed to the Category 3 Plan.   He will work on meal planning, mindful eating, and increasing his intake of fruits and vegetables.  Protein [employer]ival[employer] handout was provided today.  Exercise goals: As is.  Behavioral modification strategies: increasing lean protein intake, decreasing simple carbohydrates, increasing vegetables, increasing water intake, decreasing eating out, no skipping meals, meal planning and cooking strategies, keeping healthy foods in the home and planning for success.  Webster has agreed to follow-up with our clinic in 2 weeks with Dr. Jearld Shines or Abby Potash, PA-C. He was informed of the importance of frequent follow-up visits to maximize his success with intensive lifestyle modifications for his multiple health conditions.   Objective:   Blood pressure (!) 143/71, pulse 63, temperature 97.6 F (36.4 C), height 5\' 5"  (1.651 m), weight 226 lb (102.5 kg), SpO2 98 %. Body mass index is 37.61 kg/m.  General: Cooperative, alert, well developed, in no acute distress. HEENT: Conjunctivae and lids unremarkable. Cardiovascular: Regular rhythm.  Lungs: Normal work of breathing. Neurologic: No focal deficits.   Lab Results  Component Value Date   CREATININE 1.03 03/14/2020   BUN 16 03/14/2020   NA 140 03/14/2020   K 4.7 03/14/2020   CL 105 03/14/2020   CO2 20 03/14/2020   Lab Results  Component Value Date   ALT 13 03/14/2020   AST 16 03/14/2020   ALKPHOS 64 03/14/2020   BILITOT 0.3 03/14/2020   Lab Results  Component Value Date   HGBA1C 6.8 (H) 04/11/2015   Lab Results  Component Value Date   INSULIN 16.5 03/14/2020   Lab Results  Component Value Date   WBC 8.3 04/11/2015   HGB 12.5 (L) 04/19/2015   HCT 38.1 (L) 04/19/2015   MCV 85.3 04/11/2015   PLT 225 04/11/2015   Obesity Behavioral Intervention:   Approximately 15 minutes were spent on the discussion below.  ASK: We  discussed the diagnosis of obesity with [name] today and Herberth agreed to give [place] permission to discuss obesity behavioral modification therapy today.  ASSESS: Xavious has the diagnosis of obesity and his BMI today is 37.6. Gahel is in the action stage of change.   ADVISE: Etan was educated on the multiple health risks of obesity as well as the benefit of weight loss to improve his health. He was advised of the need for long term treatment and the importance of lifestyle modifications to improve his current health and to decrease his risk of future health problems.  AGREE: Multiple dietary modification options and treatment options were discussed and Juelz agreed to follow the recommendations documented in the above note.  ARRANGE: Emmanuelle was educated on the importance of frequent visits to treat obesity as outlined per CMS and USPSTF guidelines and agreed to schedule his next follow up appointment today.  Attestation Statements:   Reviewed by clinician on day of visit: allergies, medications, problem list, medical history, surgical history, family history, social history, and previous encounter notes.  I, Water quality scientist, CMA, am acting as Location manager for [employer], DO  I have reviewed the above documentation for accuracy and completeness, and I agree with the above. Jearld Lesch, DO

## 2020-05-23 ENCOUNTER — Other Ambulatory Visit (INDEPENDENT_AMBULATORY_CARE_PROVIDER_SITE_OTHER): Payer: Self-pay

## 2020-05-23 ENCOUNTER — Telehealth (INDEPENDENT_AMBULATORY_CARE_PROVIDER_SITE_OTHER): Payer: Self-pay

## 2020-05-23 DIAGNOSIS — E559 Vitamin D deficiency, unspecified: Secondary | ICD-10-CM

## 2020-05-23 MED ORDER — VITAMIN D (ERGOCALCIFEROL) 1.25 MG (50000 UNIT) PO CAPS: 50000.0000 [IU] | ORAL_CAPSULE | ORAL | 0 refills | Status: DC

## 2020-05-23 NOTE — Telephone Encounter (Signed)
Pt last seen by Dr. Brown.  

## 2020-05-23 NOTE — Telephone Encounter (Signed)
Pt came into office stating that he needs a refill on his Vitamin D2, pt has an appointment on 5/9, please give pt a call.

## 2020-05-23 NOTE — Telephone Encounter (Signed)
Pt is out of vitamin D, has an appt on 5/9

## 2020-05-23 NOTE — Telephone Encounter (Signed)
done

## 2020-05-23 NOTE — Telephone Encounter (Signed)
Please send vitamin D prescription for 4 weeks and notify pt.

## 2020-05-30 ENCOUNTER — Encounter (INDEPENDENT_AMBULATORY_CARE_PROVIDER_SITE_OTHER): Payer: Self-pay | Admitting: Physician Assistant

## 2020-05-30 ENCOUNTER — Ambulatory Visit (INDEPENDENT_AMBULATORY_CARE_PROVIDER_SITE_OTHER): Payer: Medicare PPO | Admitting: Physician Assistant

## 2020-05-30 ENCOUNTER — Other Ambulatory Visit: Payer: Self-pay

## 2020-05-30 VITALS — BP 142/68 | HR 58 | Temp 97.7°F | Ht 65.0 in | Wt 226.0 lb

## 2020-05-30 DIAGNOSIS — E1165 Type 2 diabetes mellitus with hyperglycemia: Secondary | ICD-10-CM | POA: Diagnosis not present

## 2020-05-30 DIAGNOSIS — Z6837 Body mass index (BMI) 37.0-37.9, adult: Secondary | ICD-10-CM

## 2020-05-30 DIAGNOSIS — E559 Vitamin D deficiency, unspecified: Secondary | ICD-10-CM | POA: Diagnosis not present

## 2020-05-30 MED ORDER — VITAMIN D (ERGOCALCIFEROL) 1.25 MG (50000 UNIT) PO CAPS: 50000.0000 [IU] | ORAL_CAPSULE | ORAL | 0 refills | Status: DC

## 2020-05-31 NOTE — Progress Notes (Signed)
Chief Complaint:   OBESITY Libero is here to discuss his progress with his obesity treatment plan along with follow-up of his obesity related diagnoses. Adger is on the Category 3 Plan and states he is following his eating plan approximately 75% of the time. Theron states he is walking 2-3 miles for 40-60 minutes 5 times per week.  Today's visit was #: 5 Starting weight: 229 lbs Starting date: 03/14/2020 Today's weight: 226 lbs Today's date: 05/30/2020 Total lbs lost to date: 3 Total lbs lost since last in-office visit: 0  Interim History: Ernesto is only eating one meal a day. He skips breakfast typically. He does not have time to make anything in the morning, and he doesn't like cold hard boiled eggs or yogurt. He states that he doesn't have time to eat cereal.  Subjective:   1. Type 2 diabetes mellitus with hyperglycemia, without long-term current use of insulin (HCC) Gerhart's fasting BGs range between 100 and 130, and his last A1c was 7.0. He is on metformin, and he denies nausea, vomiting, or diarrhea.  2. Vitamin D deficiency Benjamyn is on Vit D, and he denies nausea, vomiting, or muscle weakness.  Assessment/Plan:   1. Type 2 diabetes mellitus with hyperglycemia, without long-term current use of insulin (HCC) We will recheck labs at his next visit. Davinci will continue to follow up as directed. Good blood sugar control is important to decrease the likelihood of diabetic complications such as nephropathy, neuropathy, limb loss, blindness, coronary artery disease, and death. Intensive lifestyle modification including diet, exercise and weight loss are the first line of treatment for diabetes.   2. Vitamin D deficiency Low Vitamin D level contributes to fatigue and are associated with obesity, breast, and colon cancer. We will refill prescription Vitamin D for 1 month. Reymond will follow-up for routine testing of Vitamin D, at least 2-3 times per year to avoid  over-replacement.  - Vitamin D, Ergocalciferol, (DRISDOL) 1.25 MG (50000 UNIT) CAPS capsule; Take 1 capsule (50,000 Units total) by mouth every 7 (seven) days.  Dispense: 4 capsule; Refill: 0  3. Class 2 severe obesity with serious comorbidity and body mass index (BMI) of 37.0 to 37.9 in adult, unspecified obesity type [facility]) Zimri is currently in the action stage of change. As such, his goal is to continue with weight loss efforts. He has agreed to the Category 3 Plan.   We will recheck fasting labs at his next visit.  Exercise goals: As is.  Behavioral modification strategies: meal planning and cooking strategies and keeping healthy foods in the home.  Amaury has agreed to follow-up with our clinic in 4 weeks. He was informed of the importance of frequent follow-up visits to maximize his success with intensive lifestyle modifications for his multiple health conditions.   Objective:   Blood pressure (!) 142/68, pulse (!) 58, temperature 97.7 F (36.5 C), height 5\' 5"  (1.651 m), weight 226 lb (102.5 kg), SpO2 98 %. Body mass index is 37.61 kg/m.  General: Cooperative, alert, well developed, in no acute distress. HEENT: Conjunctivae and lids unremarkable. Cardiovascular: Regular rhythm.  Lungs: Normal work of breathing. Neurologic: No focal deficits.   Lab Results  Component Value Date   CREATININE 1.03 03/14/2020   BUN 16 03/14/2020   NA 140 03/14/2020   K 4.7 03/14/2020   CL 105 03/14/2020   CO2 20 03/14/2020   Lab Results  Component Value Date   ALT 13 03/14/2020   AST 16 03/14/2020  ALKPHOS 64 03/14/2020   BILITOT 0.3 03/14/2020   Lab Results  Component Value Date   HGBA1C 6.8 (H) 04/11/2015   Lab Results  Component Value Date   INSULIN 16.5 03/14/2020   No results found for: TSH No results found for: CHOL, HDL, LDLCALC, LDLDIRECT, TRIG, CHOLHDL Lab Results  Component Value Date   WBC 8.3 04/11/2015   HGB 12.5 (L) 04/19/2015   HCT 38.1 (L) 04/19/2015    MCV 85.3 04/11/2015   PLT 225 04/11/2015   No results found for: IRON, TIBC, FERRITIN  Obesity Behavioral Intervention:   Approximately 15 minutes were spent on the discussion below.  ASK: We discussed the diagnosis of obesity with [name] today and Isiaha agreed to give [place] permission to discuss obesity behavioral modification therapy today.  ASSESS: Rochelle has the diagnosis of obesity and his BMI today is 37.61. Kiven is in the action stage of change.   ADVISE: Danarius was educated on the multiple health risks of obesity as well as the benefit of weight loss to improve his health. He was advised of the need for long term treatment and the importance of lifestyle modifications to improve his current health and to decrease his risk of future health problems.  AGREE: Multiple dietary modification options and treatment options were discussed and Cypher agreed to follow the recommendations documented in the above note.  ARRANGE: Skyelar was educated on the importance of frequent visits to treat obesity as outlined per CMS and USPSTF guidelines and agreed to schedule his next follow up appointment today.  Attestation Statements:   Reviewed by clinician on day of visit: allergies, medications, problem list, medical history, surgical history, family history, social history, and previous encounter notes.   Wilhemena Durie, am acting as transcriptionist for [employer], PA-C.  I have reviewed the above documentation for accuracy and completeness, and I agree with the above. Abby Potash, PA-C

## 2020-06-01 ENCOUNTER — Other Ambulatory Visit (INDEPENDENT_AMBULATORY_CARE_PROVIDER_SITE_OTHER): Payer: Self-pay | Admitting: Physician Assistant

## 2020-06-01 DIAGNOSIS — E559 Vitamin D deficiency, unspecified: Secondary | ICD-10-CM

## 2020-06-27 DIAGNOSIS — E1169 Type 2 diabetes mellitus with other specified complication: Secondary | ICD-10-CM | POA: Diagnosis not present

## 2020-06-27 DIAGNOSIS — E785 Hyperlipidemia, unspecified: Secondary | ICD-10-CM | POA: Diagnosis not present

## 2020-06-27 DIAGNOSIS — E559 Vitamin D deficiency, unspecified: Secondary | ICD-10-CM | POA: Diagnosis not present

## 2020-06-27 DIAGNOSIS — Z7189 Other specified counseling: Secondary | ICD-10-CM | POA: Diagnosis not present

## 2020-06-27 DIAGNOSIS — M109 Gout, unspecified: Secondary | ICD-10-CM | POA: Diagnosis not present

## 2020-06-27 DIAGNOSIS — I10 Essential (primary) hypertension: Secondary | ICD-10-CM | POA: Diagnosis not present

## 2020-06-30 ENCOUNTER — Ambulatory Visit (INDEPENDENT_AMBULATORY_CARE_PROVIDER_SITE_OTHER): Payer: Medicare PPO | Admitting: Physician Assistant

## 2020-07-06 ENCOUNTER — Ambulatory Visit (INDEPENDENT_AMBULATORY_CARE_PROVIDER_SITE_OTHER): Payer: Medicare PPO | Admitting: Bariatrics

## 2020-07-18 ENCOUNTER — Telehealth (INDEPENDENT_AMBULATORY_CARE_PROVIDER_SITE_OTHER): Payer: Self-pay

## 2020-07-18 ENCOUNTER — Other Ambulatory Visit (INDEPENDENT_AMBULATORY_CARE_PROVIDER_SITE_OTHER): Payer: Self-pay | Admitting: Physician Assistant

## 2020-07-18 DIAGNOSIS — E559 Vitamin D deficiency, unspecified: Secondary | ICD-10-CM

## 2020-07-18 NOTE — Telephone Encounter (Signed)
L/mess om for pt to call. Re: Linus Orn will refill vitamin D at next visit. CAS

## 2020-07-18 NOTE — Telephone Encounter (Signed)
Patient is out of his Vit D.  He is scheduled to see Linus Orn on 7/6.  Please send refill to [facility] on [address].   Thank you

## 2020-07-18 NOTE — Telephone Encounter (Signed)
I'll refill when I see him. Thanks

## 2020-07-18 NOTE — Telephone Encounter (Signed)
Ok to refill 

## 2020-07-18 NOTE — Telephone Encounter (Signed)
Pt last seen by Tracey Aguilar, PA-C.  

## 2020-07-19 ENCOUNTER — Other Ambulatory Visit (INDEPENDENT_AMBULATORY_CARE_PROVIDER_SITE_OTHER): Payer: Self-pay | Admitting: Bariatrics

## 2020-07-19 DIAGNOSIS — E559 Vitamin D deficiency, unspecified: Secondary | ICD-10-CM

## 2020-07-19 NOTE — Telephone Encounter (Signed)
Spoke w/ pt-CAS 

## 2020-07-26 NOTE — Telephone Encounter (Signed)
Error

## 2020-07-27 ENCOUNTER — Ambulatory Visit (INDEPENDENT_AMBULATORY_CARE_PROVIDER_SITE_OTHER): Payer: Medicare PPO | Admitting: Physician Assistant

## 2020-07-27 ENCOUNTER — Other Ambulatory Visit: Payer: Self-pay

## 2020-07-27 ENCOUNTER — Other Ambulatory Visit (INDEPENDENT_AMBULATORY_CARE_PROVIDER_SITE_OTHER): Payer: Self-pay | Admitting: Physician Assistant

## 2020-07-27 ENCOUNTER — Encounter (INDEPENDENT_AMBULATORY_CARE_PROVIDER_SITE_OTHER): Payer: Self-pay | Admitting: Physician Assistant

## 2020-07-27 VITALS — BP 131/50 | HR 73 | Temp 98.4°F | Ht 65.0 in | Wt 223.0 lb

## 2020-07-27 DIAGNOSIS — E559 Vitamin D deficiency, unspecified: Secondary | ICD-10-CM

## 2020-07-27 DIAGNOSIS — Z6837 Body mass index (BMI) 37.0-37.9, adult: Secondary | ICD-10-CM | POA: Diagnosis not present

## 2020-07-27 DIAGNOSIS — E1169 Type 2 diabetes mellitus with other specified complication: Secondary | ICD-10-CM | POA: Diagnosis not present

## 2020-07-27 DIAGNOSIS — E1165 Type 2 diabetes mellitus with hyperglycemia: Secondary | ICD-10-CM | POA: Diagnosis not present

## 2020-07-27 DIAGNOSIS — E785 Hyperlipidemia, unspecified: Secondary | ICD-10-CM

## 2020-07-27 MED ORDER — VITAMIN D (ERGOCALCIFEROL) 1.25 MG (50000 UNIT) PO CAPS: 50000.0000 [IU] | ORAL_CAPSULE | ORAL | 0 refills | Status: DC

## 2020-07-28 LAB — LIPID PANEL
Chol/HDL Ratio: 4.1 ratio (ref 0.0–5.0)
Cholesterol, Total: 199 mg/dL (ref 100–199)
HDL: 49 mg/dL (ref >39)
LDL Chol Calc (NIH): 113 mg/dL — ABNORMAL HIGH (ref 0–99)
Triglycerides: 211 mg/dL — ABNORMAL HIGH (ref 0–149)
VLDL Cholesterol Cal: 37 mg/dL (ref 5–40)

## 2020-07-28 LAB — COMPREHENSIVE METABOLIC PANEL
ALT: 16 IU/L (ref 0–44)
AST: 20 IU/L (ref 0–40)
Albumin/Globulin Ratio: 1.6 (ref 1.2–2.2)
Albumin: 4.4 g/dL (ref 3.7–4.7)
Alkaline Phosphatase: 68 IU/L (ref 44–121)
BUN/Creatinine Ratio: 18 (ref 10–24)
BUN: 23 mg/dL (ref 8–27)
Bilirubin Total: 0.5 mg/dL (ref 0.0–1.2)
CO2: 18 mmol/L — ABNORMAL LOW (ref 20–29)
Calcium: 10.1 mg/dL (ref 8.6–10.2)
Chloride: 106 mmol/L (ref 96–106)
Creatinine, Ser: 1.26 mg/dL (ref 0.76–1.27)
Globulin, Total: 2.8 g/dL (ref 1.5–4.5)
Glucose: 113 mg/dL — ABNORMAL HIGH (ref 65–99)
Potassium: 5 mmol/L (ref 3.5–5.2)
Sodium: 140 mmol/L (ref 134–144)
Total Protein: 7.2 g/dL (ref 6.0–8.5)
eGFR: 61 mL/min/{1.73_m2} (ref >59)

## 2020-07-28 LAB — HEMOGLOBIN A1C
Est. average glucose Bld gHb Est-mCnc: 160 mg/dL
Hgb A1c MFr Bld: 7.2 % — ABNORMAL HIGH (ref 4.8–5.6)

## 2020-07-28 LAB — INSULIN, RANDOM: INSULIN: 13.2 u[IU]/mL (ref 2.6–24.9)

## 2020-07-28 LAB — VITAMIN D 25 HYDROXY (VIT D DEFICIENCY, FRACTURES): Vit D, 25-Hydroxy: 26.6 ng/mL — ABNORMAL LOW (ref 30.0–100.0)

## 2020-08-02 NOTE — Progress Notes (Signed)
Chief Complaint:   OBESITY Demont is here to discuss his progress with his obesity treatment plan along with follow-up of his obesity related diagnoses. Ilai is on the Category 3 Plan and states he is following his eating plan approximately 60-70% of the time. Dmani states he is at the gym for 45-60 minutes or walking 2-3 miles 5-6 times per week.   Today's visit was #: 6 Starting weight: 229 lbs Starting date: 03/14/2020 Today's weight: 223 lbs Today's date: 07/27/2020 Total lbs lost to date: 6 Total lbs lost since last in-office visit: 3  Interim History: Kohlton's wife cooks all of his meals. He cut back on sodas and he states he has been portion controlling better. He continues to only eat 1 meal a day, which is dinner. He does not want to pack  food and take it with him. He eats more on there weekend when he doesn't work.  Subjective:   1. Vitamin D deficiency Swayze is on Vit D weekly.  2. Type 2 diabetes mellitus with hyperglycemia, without long-term current use of insulin (HCC) Muriel is on metformin, and he denies nausea, vomiting, or diarrhea. Last A1c was 7.0.  3. Hyperlipidemia associated with type 2 diabetes mellitus ([place]) Jamille is on Lipitor, and he is tolerating it well.  Assessment/Plan:   1. Vitamin D deficiency Low Vitamin D level contributes to fatigue and are associated with obesity, breast, and colon cancer. We will check labs today, and we will refill prescription Vitamin D for 1 month. Sreekar will follow-up for routine testing of Vitamin D, at least 2-3 times per year to avoid over-replacement.  - Vitamin D, Ergocalciferol, (DRISDOL) 1.25 MG (50000 UNIT) CAPS capsule; Take 1 capsule (50,000 Units total) by mouth every 7 (seven) days.  Dispense: 4 capsule; Refill: 0 - VITAMIN D 25 Hydroxy (Vit-D Deficiency, Fractures)  2. Type 2 diabetes mellitus with hyperglycemia, without long-term current use of insulin (HCC) We will check labs today. Teddie will  continue to follow up as directed. Good blood sugar control is important to decrease the likelihood of diabetic complications such as nephropathy, neuropathy, limb loss, blindness, coronary artery disease, and death. Intensive lifestyle modification including diet, exercise and weight loss are the first line of treatment for diabetes.   - Comprehensive metabolic panel - Hemoglobin A1c - Insulin, random  3. Hyperlipidemia associated with type 2 diabetes mellitus ([place]) Cardiovascular risk and specific lipid/LDL goals reviewed. We discussed several lifestyle modifications today. We will check labs today. Coleman will continue to work on diet, exercise and weight loss efforts. Orders and follow up as documented in patient record.   Counseling Intensive lifestyle modifications are the first line treatment for this issue. Dietary changes: Increase soluble fiber. Decrease simple carbohydrates. Exercise changes: Moderate to vigorous-intensity aerobic activity 150 minutes per week if tolerated. Lipid-lowering medications: see documented in medical record.  - Lipid panel  4. Class 2 severe obesity with serious comorbidity and body mass index (BMI) of 37.0 to 37.9 in adult, unspecified obesity type [facility]) Yash is currently in the action stage of change. As such, his goal is to continue with weight loss efforts. He has agreed to the Category 3 Plan.   Osker is to add protein bar and protein shake daily.  Exercise goals: As is.  Behavioral modification strategies: meal planning and cooking strategies and keeping healthy foods in the home.  Carlon has agreed to follow-up with our clinic in 2 weeks. He was informed of the importance  of frequent follow-up visits to maximize his success with intensive lifestyle modifications for his multiple health conditions.   Rishi was informed we would discuss his lab results at his next visit unless there is a critical issue that needs to be addressed sooner.  Branston agreed to keep his next visit at the agreed upon time to discuss these results.  Objective:   Blood pressure (!) 131/50, pulse 73, temperature 98.4 F (36.9 C), height 5\' 5"  (1.651 m), weight 223 lb (101.2 kg), SpO2 98 %. Body mass index is 37.11 kg/m.  General: Cooperative, alert, well developed, in no acute distress. HEENT: Conjunctivae and lids unremarkable. Cardiovascular: Regular rhythm.  Lungs: Normal work of breathing. Neurologic: No focal deficits.   Lab Results  Component Value Date   CREATININE 1.26 07/27/2020   BUN 23 07/27/2020   NA 140 07/27/2020   K 5.0 07/27/2020   CL 106 07/27/2020   CO2 18 (L) 07/27/2020   Lab Results  Component Value Date   ALT 16 07/27/2020   AST 20 07/27/2020   ALKPHOS 68 07/27/2020   BILITOT 0.5 07/27/2020   Lab Results  Component Value Date   HGBA1C 7.2 (H) 07/27/2020   HGBA1C 6.8 (H) 04/11/2015   Lab Results  Component Value Date   INSULIN 13.2 07/27/2020   INSULIN 16.5 03/14/2020   No results found for: TSH Lab Results  Component Value Date   CHOL 199 07/27/2020   HDL 49 07/27/2020   LDLCALC 113 (H) 07/27/2020   TRIG 211 (H) 07/27/2020   CHOLHDL 4.1 07/27/2020   Lab Results  Component Value Date   VD25OH 26.6 (L) 07/27/2020   VD25OH 13.1 (L) 03/14/2020   Lab Results  Component Value Date   WBC 8.3 04/11/2015   HGB 12.5 (L) 04/19/2015   HCT 38.1 (L) 04/19/2015   MCV 85.3 04/11/2015   PLT 225 04/11/2015   No results found for: IRON, TIBC, FERRITIN  Obesity Behavioral Intervention:   Approximately 15 minutes were spent on the discussion below.  ASK: We discussed the diagnosis of obesity with [name] today and Johndavid agreed to give [place] permission to discuss obesity behavioral modification therapy today.  ASSESS: Leeon has the diagnosis of obesity and his BMI today is 37.11. Ferris is in the action stage of change.   ADVISE: Eytan was educated on the multiple health risks of obesity as well as the  benefit of weight loss to improve his health. He was advised of the need for long term treatment and the importance of lifestyle modifications to improve his current health and to decrease his risk of future health problems.  AGREE: Multiple dietary modification options and treatment options were discussed and Marquis agreed to follow the recommendations documented in the above note.  ARRANGE: Grayson was educated on the importance of frequent visits to treat obesity as outlined per CMS and USPSTF guidelines and agreed to schedule his next follow up appointment today.  Attestation Statements:   Reviewed by clinician on day of visit: allergies, medications, problem list, medical history, surgical history, family history, social history, and previous encounter notes.   Wilhemena Durie, am acting as transcriptionist for [employer], PA-C.  I have reviewed the above documentation for accuracy and completeness, and I agree with the above. Abby Potash, PA-C

## 2020-08-03 DIAGNOSIS — C61 Malignant neoplasm of prostate: Secondary | ICD-10-CM | POA: Diagnosis not present

## 2020-08-10 DIAGNOSIS — N5201 Erectile dysfunction due to arterial insufficiency: Secondary | ICD-10-CM | POA: Diagnosis not present

## 2020-08-10 DIAGNOSIS — C61 Malignant neoplasm of prostate: Secondary | ICD-10-CM | POA: Diagnosis not present

## 2020-08-24 ENCOUNTER — Ambulatory Visit (INDEPENDENT_AMBULATORY_CARE_PROVIDER_SITE_OTHER): Payer: Medicare PPO | Admitting: Physician Assistant

## 2020-08-25 ENCOUNTER — Encounter (INDEPENDENT_AMBULATORY_CARE_PROVIDER_SITE_OTHER): Payer: Self-pay | Admitting: Bariatrics

## 2020-08-25 ENCOUNTER — Ambulatory Visit (INDEPENDENT_AMBULATORY_CARE_PROVIDER_SITE_OTHER): Payer: Medicare PPO | Admitting: Bariatrics

## 2020-08-25 ENCOUNTER — Other Ambulatory Visit: Payer: Self-pay

## 2020-08-25 VITALS — BP 118/62 | HR 60 | Temp 98.5°F | Ht 65.0 in | Wt 224.0 lb

## 2020-08-25 DIAGNOSIS — E1169 Type 2 diabetes mellitus with other specified complication: Secondary | ICD-10-CM | POA: Diagnosis not present

## 2020-08-25 DIAGNOSIS — Z6838 Body mass index (BMI) 38.0-38.9, adult: Secondary | ICD-10-CM

## 2020-08-25 DIAGNOSIS — E559 Vitamin D deficiency, unspecified: Secondary | ICD-10-CM

## 2020-08-25 DIAGNOSIS — E669 Obesity, unspecified: Secondary | ICD-10-CM

## 2020-08-25 MED ORDER — VITAMIN D (ERGOCALCIFEROL) 1.25 MG (50000 UNIT) PO CAPS: 50000.0000 [IU] | ORAL_CAPSULE | ORAL | 0 refills | Status: DC

## 2020-08-26 NOTE — Progress Notes (Signed)
Chief Complaint:   OBESITY Andrew Ho is here to discuss his progress with his obesity treatment plan along with follow-up of his obesity related diagnoses. Andrew Ho is on the Category 3 Plan and states he is following his eating plan approximately 75% of the time. Andrew Ho states he is walking/cardio  60 minutes 2-3 times per week.  Today's visit was #: 7 Starting weight: 229 lbs Starting date: 03/14/2020 Today's weight: 224 lbs Today's date: 08/25/2020 Total lbs lost to date: 5 lbs Total lbs lost since last in-office visit: 0 lbs  Interim History: Andrew Ho is up 1 lb since his last visit. He is getting adequate water and protein.  Subjective:   1. Vitamin D deficiency He is currently taking prescription vitamin D 50,000 IU each week. He denies nausea, vomiting or muscle weakness.  Lab Results  Component Value Date   VD25OH 26.6 (L) 07/27/2020   VD25OH 13.1 (L) 03/14/2020   2. Diabetes mellitus type 2 in obese Andrew Ho) Andrew Ho reports to have a normal appetite. He is currently taking Metformin.  Lab Results  Component Value Date   HGBA1C 7.2 (H) 07/27/2020   HGBA1C 6.8 (H) 04/11/2015   Lab Results  Component Value Date   LDLCALC 113 (H) 07/27/2020   CREATININE 1.26 07/27/2020   Lab Results  Component Value Date   INSULIN 13.2 07/27/2020   INSULIN 16.5 03/14/2020   Assessment/Plan:   1. Vitamin D deficiency Low Vitamin D level contributes to fatigue and are associated with obesity, breast, and colon cancer. He agrees to continue to take prescription Vitamin D '@50'$ ,000 IU every week and will follow-up for routine testing of Vitamin D, at least 2-3 times per year to avoid over-replacement.  - Vitamin D, Ergocalciferol, (DRISDOL) 1.25 MG (50000 UNIT) CAPS capsule; Take 1 capsule (50,000 Units total) by mouth every 7 (seven) days.  Dispense: 4 capsule; Refill: 0  2. Diabetes mellitus type 2 in obese (HCC) Good blood sugar control is important to decrease the likelihood of  diabetic complications such as nephropathy, neuropathy, limb loss, blindness, coronary artery disease, and death. Intensive lifestyle modification including diet, exercise and weight loss are the first line of treatment for diabetes. Andrew Ho will continue taking Metformin. "Making smart fruit choices" handout was given to Andrew Ho.  3. Obesity, current BMI 37.3 Andrew Ho is currently in the action stage of change. As such, his goal is to continue with weight loss efforts. He has agreed to the Category 3 Plan.   Meal planning Intentional eating  Exercise goals: Continue with walking an cardio.  Behavioral modification strategies: increasing lean protein intake, decreasing simple carbohydrates, increasing vegetables, increasing water intake, decreasing eating out, no skipping meals, meal planning and cooking strategies, keeping healthy foods in the home, and planning for success.  Andrew Ho has agreed to follow-up with our clinic in 3 weeks. He was informed of the importance of frequent follow-up visits to maximize his success with intensive lifestyle modifications for his multiple health conditions.   Objective:   Blood pressure 118/62, pulse 60, temperature 98.5 F (36.9 C), height '5\' 5"'$  (1.651 m), weight 224 lb (101.6 kg), SpO2 98 %. Body mass index is 37.28 kg/m.  General: Cooperative, alert, well developed, in no acute distress. HEENT: Conjunctivae and lids unremarkable. Cardiovascular: Regular rhythm.  Lungs: Normal work of breathing. Neurologic: No focal deficits.   Lab Results  Component Value Date   CREATININE 1.26 07/27/2020   BUN 23 07/27/2020   NA 140 07/27/2020   K 5.0 07/27/2020  CL 106 07/27/2020   CO2 18 (L) 07/27/2020   Lab Results  Component Value Date   ALT 16 07/27/2020   AST 20 07/27/2020   ALKPHOS 68 07/27/2020   BILITOT 0.5 07/27/2020   Lab Results  Component Value Date   HGBA1C 7.2 (H) 07/27/2020   HGBA1C 6.8 (H) 04/11/2015   Lab Results  Component Value  Date   INSULIN 13.2 07/27/2020   INSULIN 16.5 03/14/2020   No results found for: TSH Lab Results  Component Value Date   CHOL 199 07/27/2020   HDL 49 07/27/2020   LDLCALC 113 (H) 07/27/2020   TRIG 211 (H) 07/27/2020   CHOLHDL 4.1 07/27/2020   Lab Results  Component Value Date   VD25OH 26.6 (L) 07/27/2020   VD25OH 13.1 (L) 03/14/2020   Lab Results  Component Value Date   WBC 8.3 04/11/2015   HGB 12.5 (L) 04/19/2015   HCT 38.1 (L) 04/19/2015   MCV 85.3 04/11/2015   PLT 225 04/11/2015   No results found for: IRON, TIBC, FERRITIN  Obesity Behavioral Intervention:   Approximately 15 minutes were spent on the discussion below.  ASK: We discussed the diagnosis of obesity with Andrew Ho today and Andrew Ho agreed to give [place] permission to discuss obesity behavioral modification therapy today.  ASSESS: Andrew Ho has the diagnosis of obesity and his BMI today is 37.4. Andrew Ho is in the action stage of change.   ADVISE: Andrew Ho was educated on the multiple health risks of obesity as well as the benefit of weight loss to improve his health. He was advised of the need for long term treatment and the importance of lifestyle modifications to improve his current health and to decrease his risk of future health problems.  AGREE: Multiple dietary modification options and treatment options were discussed and Andrew Ho agreed to follow the recommendations documented in the above note.  ARRANGE: Andrew Ho was educated on the importance of frequent visits to treat obesity as outlined per CMS and USPSTF guidelines and agreed to schedule his next follow up appointment today.  Attestation Statements:   Reviewed by clinician on day of visit: allergies, medications, problem list, medical history, surgical history, family history, social history, and previous encounter notes.  ILennette Bihari, CMA, am acting as transcriptionist for Dr. Jearld Lesch, DO.  I have reviewed the above documentation for accuracy and  completeness, and I agree with the above. Jearld Lesch, DO

## 2020-08-29 ENCOUNTER — Encounter (INDEPENDENT_AMBULATORY_CARE_PROVIDER_SITE_OTHER): Payer: Self-pay | Admitting: Bariatrics

## 2020-09-02 DIAGNOSIS — J338 Other polyp of sinus: Secondary | ICD-10-CM | POA: Diagnosis not present

## 2020-09-02 DIAGNOSIS — J310 Chronic rhinitis: Secondary | ICD-10-CM | POA: Diagnosis not present

## 2020-09-02 DIAGNOSIS — J343 Hypertrophy of nasal turbinates: Secondary | ICD-10-CM | POA: Diagnosis not present

## 2020-09-06 ENCOUNTER — Other Ambulatory Visit: Payer: Self-pay | Admitting: Otolaryngology

## 2020-09-06 DIAGNOSIS — J339 Nasal polyp, unspecified: Secondary | ICD-10-CM

## 2020-09-22 ENCOUNTER — Other Ambulatory Visit: Payer: Self-pay

## 2020-09-22 ENCOUNTER — Encounter (INDEPENDENT_AMBULATORY_CARE_PROVIDER_SITE_OTHER): Payer: Self-pay | Admitting: Bariatrics

## 2020-09-22 ENCOUNTER — Ambulatory Visit (INDEPENDENT_AMBULATORY_CARE_PROVIDER_SITE_OTHER): Payer: Medicare PPO | Admitting: Bariatrics

## 2020-09-22 VITALS — BP 111/55 | HR 58 | Temp 98.5°F | Ht 65.0 in | Wt 223.0 lb

## 2020-09-22 DIAGNOSIS — Z6838 Body mass index (BMI) 38.0-38.9, adult: Secondary | ICD-10-CM | POA: Diagnosis not present

## 2020-09-22 DIAGNOSIS — E669 Obesity, unspecified: Secondary | ICD-10-CM | POA: Diagnosis not present

## 2020-09-22 DIAGNOSIS — E1169 Type 2 diabetes mellitus with other specified complication: Secondary | ICD-10-CM | POA: Diagnosis not present

## 2020-09-22 DIAGNOSIS — E559 Vitamin D deficiency, unspecified: Secondary | ICD-10-CM

## 2020-09-22 MED ORDER — VITAMIN D (ERGOCALCIFEROL) 1.25 MG (50000 UNIT) PO CAPS: 50000.0000 [IU] | ORAL_CAPSULE | ORAL | 0 refills | Status: DC

## 2020-09-23 ENCOUNTER — Ambulatory Visit
Admission: RE | Admit: 2020-09-23 | Discharge: 2020-09-23 | Disposition: A | Discharge status: Home / Self Care | Payer: Medicare PPO | Source: Ambulatory Visit | Attending: Otolaryngology | Admitting: Otolaryngology

## 2020-09-23 DIAGNOSIS — J339 Nasal polyp, unspecified: Secondary | ICD-10-CM | POA: Diagnosis not present

## 2020-09-23 DIAGNOSIS — J342 Deviated nasal septum: Secondary | ICD-10-CM | POA: Diagnosis not present

## 2020-09-23 DIAGNOSIS — J3489 Other specified disorders of nose and nasal sinuses: Secondary | ICD-10-CM | POA: Diagnosis not present

## 2020-09-27 ENCOUNTER — Encounter (INDEPENDENT_AMBULATORY_CARE_PROVIDER_SITE_OTHER): Payer: Self-pay | Admitting: Bariatrics

## 2020-09-27 NOTE — Progress Notes (Signed)
Chief Complaint:   OBESITY Andrew Ho is here to discuss his progress with his obesity treatment plan along with follow-up of his obesity related diagnoses. Andrew Ho is on the Category 3 Plan and states he is following his eating plan approximately 80% of the time. Andrew Ho states he is walking and going to the gym for 45 minutes 3 times per week.  Today's visit was #: 8 Starting weight: 229 lbs Starting date: 03/14/2020 Today's weight: 223 lbs Today's date:09/22/2020 Total lbs lost to date: 6 lbs Total lbs lost since last in-office visit: 1 lb  Interim History: Andrew Ho is down 1 additional pound. He states he is consistent with H2O. He struggles with protein.  Subjective:   1. Vitamin D deficiency He is currently taking prescription vitamin D 50,000 IU each week. He denies nausea, vomiting or muscle weakness.  2. Diabetes mellitus type 2 in obese [facility]) Andrew Ho is currently taking Glucophage.  Assessment/Plan:   1. Vitamin D deficiency Low Vitamin D level contributes to fatigue and are associated with obesity, breast, and colon cancer. We will refill prescription Vitamin D 50,000 IU every week for 1 month with no refills and Andrew Ho will follow-up for routine testing of Vitamin D, at least 2-3 times per year to avoid over-replacement.  - Vitamin D, Ergocalciferol, (DRISDOL) 1.25 MG (50000 UNIT) CAPS capsule; Take 1 capsule (50,000 Units total) by mouth every 7 (seven) days.  Dispense: 4 capsule; Refill: 0  2. Diabetes mellitus type 2 in obese [facility]) Andrew Ho will continue taking Glucophage. Good blood sugar control is important to decrease the likelihood of diabetic complications such as nephropathy, neuropathy, limb loss, blindness, coronary artery disease, and death. Intensive lifestyle modification including diet, exercise and weight loss are the first line of treatment for diabetes.    3. Obesity, current BMI 37.1 Andrew Ho is currently in the action stage of change. As such, his goal is  to continue with weight loss efforts. He has agreed to the Category 3 Plan.   Andrew Ho will continue meal planning. He will adhere closely to the plan. He will increase protein. He will increase H2O intake.  Exercise goals:  As is.  Behavioral modification strategies: increasing lean protein intake, decreasing simple carbohydrates, increasing vegetables, increasing water intake, and planning for success.  Andrew Ho has agreed to follow-up with our clinic in 3-4 weeks. He was informed of the importance of frequent follow-up visits to maximize his success with intensive lifestyle modifications for his multiple health conditions.   Objective:   Blood pressure (!) 111/55, pulse (!) 58, temperature 98.5 F (36.9 C), height '5\' 5"'$  (1.651 m), weight 223 lb (101.2 kg), SpO2 99 %. Body mass index is 37.11 kg/m.  General: Cooperative, alert, well developed, in no acute distress. HEENT: Conjunctivae and lids unremarkable. Cardiovascular: Regular rhythm.  Lungs: Normal work of breathing. Neurologic: No focal deficits.   Lab Results  Component Value Date   CREATININE 1.26 07/27/2020   BUN 23 07/27/2020   NA 140 07/27/2020   K 5.0 07/27/2020   CL 106 07/27/2020   CO2 18 (L) 07/27/2020   Lab Results  Component Value Date   ALT 16 07/27/2020   AST 20 07/27/2020   ALKPHOS 68 07/27/2020   BILITOT 0.5 07/27/2020   Lab Results  Component Value Date   HGBA1C 7.2 (H) 07/27/2020   HGBA1C 6.8 (H) 04/11/2015   Lab Results  Component Value Date   INSULIN 13.2 07/27/2020   INSULIN 16.5 03/14/2020   No results found  for: TSH Lab Results  Component Value Date   CHOL 199 07/27/2020   HDL 49 07/27/2020   LDLCALC 113 (H) 07/27/2020   TRIG 211 (H) 07/27/2020   CHOLHDL 4.1 07/27/2020   Lab Results  Component Value Date   VD25OH 26.6 (L) 07/27/2020   VD25OH 13.1 (L) 03/14/2020   Lab Results  Component Value Date   WBC 8.3 04/11/2015   HGB 12.5 (L) 04/19/2015   HCT 38.1 (L) 04/19/2015   MCV  85.3 04/11/2015   PLT 225 04/11/2015   No results found for: IRON, TIBC, FERRITIN  Obesity Behavioral Intervention:   Approximately 15 minutes were spent on the discussion below.  ASK: We discussed the diagnosis of obesity with Andrew Ho today and Andrew Ho agreed to give [place] permission to discuss obesity behavioral modification therapy today.  ASSESS: Andrew Ho has the diagnosis of obesity and his BMI today is 37.1. Andrew Ho is in the action stage of change.   ADVISE: Andrew Ho was educated on the multiple health risks of obesity as well as the benefit of weight loss to improve his health. He was advised of the need for long term treatment and the importance of lifestyle modifications to improve his current health and to decrease his risk of future health problems.  AGREE: Multiple dietary modification options and treatment options were discussed and Andrew Ho agreed to follow the recommendations documented in the above note.  ARRANGE: Andrew Ho was educated on the importance of frequent visits to treat obesity as outlined per CMS and USPSTF guidelines and agreed to schedule his next follow up appointment today.  Attestation Statements:   Reviewed by clinician on day of visit: allergies, medications, problem list, medical history, surgical history, family history, social history, and previous encounter notes.  I, Lizbeth Bark, RMA, am acting as Location manager for [employer], DO.   I have reviewed the above documentation for accuracy and completeness, and I agree with the above. Jearld Lesch, DO

## 2020-10-03 DIAGNOSIS — J324 Chronic pansinusitis: Secondary | ICD-10-CM | POA: Diagnosis not present

## 2020-10-03 DIAGNOSIS — J338 Other polyp of sinus: Secondary | ICD-10-CM | POA: Diagnosis not present

## 2020-10-04 ENCOUNTER — Other Ambulatory Visit: Payer: Self-pay | Admitting: Otolaryngology

## 2020-10-17 ENCOUNTER — Ambulatory Visit (INDEPENDENT_AMBULATORY_CARE_PROVIDER_SITE_OTHER): Payer: Medicare PPO | Admitting: Bariatrics

## 2020-10-17 ENCOUNTER — Other Ambulatory Visit: Payer: Self-pay

## 2020-10-17 ENCOUNTER — Encounter (INDEPENDENT_AMBULATORY_CARE_PROVIDER_SITE_OTHER): Payer: Self-pay | Admitting: Bariatrics

## 2020-10-17 VITALS — BP 145/76 | HR 58 | Temp 98.1°F | Ht 65.0 in | Wt 228.0 lb

## 2020-10-17 DIAGNOSIS — E559 Vitamin D deficiency, unspecified: Secondary | ICD-10-CM

## 2020-10-17 DIAGNOSIS — E1159 Type 2 diabetes mellitus with other circulatory complications: Secondary | ICD-10-CM | POA: Diagnosis not present

## 2020-10-17 DIAGNOSIS — Z6838 Body mass index (BMI) 38.0-38.9, adult: Secondary | ICD-10-CM | POA: Diagnosis not present

## 2020-10-17 DIAGNOSIS — I152 Hypertension secondary to endocrine disorders: Secondary | ICD-10-CM | POA: Diagnosis not present

## 2020-10-17 MED ORDER — VITAMIN D (ERGOCALCIFEROL) 1.25 MG (50000 UNIT) PO CAPS: 50000.0000 [IU] | ORAL_CAPSULE | ORAL | 0 refills | Status: AC

## 2020-10-17 NOTE — Progress Notes (Signed)
Chief Complaint:   OBESITY Andrew Ho is here to discuss his progress with his obesity treatment plan along with follow-up of his obesity related diagnoses. Andrew Ho is on the Category 3 Plan and states he is following his eating plan approximately 80% of the time. Andrew Ho states he is walking for 45 minutes 3-4 times per week and cardio for 60 minutes 4 times per week.  Today's visit was #: 9 Starting weight: 229 lbs Starting date: 03/14/2020 Today's weight: 228 lbs Today's date: 10/17/2020 Total lbs lost to date: 1 lbs Total lbs lost since last in-office visit: 0  Interim History: Andrew Ho is up 5 lbs since his last visit. He is eating more fruit and vegetables. His body water is about 20 lbs of water.   Subjective:   1. Vitamin D deficiency Andrew Ho is taking medication as directed.  2. Hypertension associated with diabetes (Andrew Ho) Andrew Ho's hypertension is reasonably well controlled.   Assessment/Plan:   1. Vitamin D deficiency Low Vitamin D level contributes to fatigue and are associated with obesity, breast, and colon cancer. We will refill prescription Vitamin D 50,000 IU every week for 1 month with no refills and Andrew Ho will follow-up for routine testing of Vitamin D, at least 2-3 times per year to avoid over-replacement.  - Vitamin D, Ergocalciferol, (DRISDOL) 1.25 MG (50000 UNIT) CAPS capsule; Take 1 capsule (50,000 Units total) by mouth every 7 (seven) days.  Dispense: 4 capsule; Refill: 0  2. Hypertension associated with diabetes (Andrew Ho) Andrew Ho will continue his medications. He is working on healthy weight loss and exercise to improve blood pressure control. We will watch for signs of hypotension as he continues his lifestyle modifications.  3. Obesity, current BMI 38 Andrew Ho is currently in the action stage of change. As such, his goal is to continue with weight loss efforts. He has agreed to the Category 3 Plan.   Andrew Ho will continue meal planning and intentional  eating.  Exercise goals:  Andrew Ho will continue walking and going to the gym.  Behavioral modification strategies: increasing lean protein intake, decreasing simple carbohydrates, increasing vegetables, increasing water intake, decreasing eating out, no skipping meals, meal planning and cooking strategies, keeping healthy foods in the home, and planning for success.  Andrew Ho has agreed to follow-up with our clinic in 3-4 weeks. He was informed of the importance of frequent follow-up visits to maximize his success with intensive lifestyle modifications for his multiple health conditions.   Objective:   Blood pressure (!) 145/76, pulse (!) 58, temperature 98.1 F (36.7 C), height 5\' 5"  (1.651 m), weight 228 lb (103.4 kg), SpO2 96 %. Body mass index is 37.94 kg/m.  General: Cooperative, alert, well developed, in no acute distress. HEENT: Conjunctivae and lids unremarkable. Cardiovascular: Regular rhythm.  Lungs: Normal work of breathing. Neurologic: No focal deficits.   Lab Results  Component Value Date   CREATININE 1.26 07/27/2020   BUN 23 07/27/2020   NA 140 07/27/2020   K 5.0 07/27/2020   CL 106 07/27/2020   CO2 18 (L) 07/27/2020   Lab Results  Component Value Date   ALT 16 07/27/2020   AST 20 07/27/2020   ALKPHOS 68 07/27/2020   BILITOT 0.5 07/27/2020   Lab Results  Component Value Date   HGBA1C 7.2 (H) 07/27/2020   HGBA1C 6.8 (H) 04/11/2015   Lab Results  Component Value Date   INSULIN 13.2 07/27/2020   INSULIN 16.5 03/14/2020   No results found for: TSH Lab Results  Component Value  Date   CHOL 199 07/27/2020   HDL 49 07/27/2020   LDLCALC 113 (H) 07/27/2020   TRIG 211 (H) 07/27/2020   CHOLHDL 4.1 07/27/2020   Lab Results  Component Value Date   VD25OH 26.6 (L) 07/27/2020   VD25OH 13.1 (L) 03/14/2020   Lab Results  Component Value Date   WBC 8.3 04/11/2015   HGB 12.5 (L) 04/19/2015   HCT 38.1 (L) 04/19/2015   MCV 85.3 04/11/2015   PLT 225 04/11/2015    No results found for: IRON, TIBC, FERRITIN  Obesity Behavioral Intervention:   Approximately 15 minutes were spent on the discussion below.  ASK: We discussed the diagnosis of obesity with Andrew Ho today and Andrew Ho agreed to give [place] permission to discuss obesity behavioral modification therapy today.  ASSESS: Mico has the diagnosis of obesity and his BMI today is 38.0. Kiven is in the action stage of change.   ADVISE: Andrew Ho was educated on the multiple health risks of obesity as well as the benefit of weight loss to improve his health. He was advised of the need for long term treatment and the importance of lifestyle modifications to improve his current health and to decrease his risk of future health problems.  AGREE: Multiple dietary modification options and treatment options were discussed and Andrew Ho agreed to follow the recommendations documented in the above note.  ARRANGE: Andrew Ho was educated on the importance of frequent visits to treat obesity as outlined per CMS and USPSTF guidelines and agreed to schedule his next follow up appointment today.  Attestation Statements:   Reviewed by clinician on day of visit: allergies, medications, problem list, medical history, surgical history, family history, social history, and previous encounter notes.  I, Lizbeth Bark, RMA, am acting as Location manager for [employer], DO.   I have reviewed the above documentation for accuracy and completeness, and I agree with the above. Jearld Lesch, DO

## 2020-10-18 ENCOUNTER — Encounter (INDEPENDENT_AMBULATORY_CARE_PROVIDER_SITE_OTHER): Payer: Self-pay | Admitting: Bariatrics

## 2020-11-02 ENCOUNTER — Encounter (HOSPITAL_BASED_OUTPATIENT_CLINIC_OR_DEPARTMENT_OTHER): Payer: Self-pay | Admitting: Otolaryngology

## 2020-11-02 ENCOUNTER — Other Ambulatory Visit: Payer: Self-pay

## 2020-11-02 ENCOUNTER — Encounter (HOSPITAL_BASED_OUTPATIENT_CLINIC_OR_DEPARTMENT_OTHER)
Admission: RE | Admit: 2020-11-02 | Discharge: 2020-11-02 | Disposition: A | Discharge status: Home / Self Care | Payer: Medicare PPO | Source: Ambulatory Visit | Attending: Otolaryngology | Admitting: Otolaryngology

## 2020-11-02 DIAGNOSIS — Z01812 Encounter for preprocedural laboratory examination: Secondary | ICD-10-CM | POA: Insufficient documentation

## 2020-11-02 LAB — BASIC METABOLIC PANEL
Anion gap: 12 (ref 5–15)
BUN: 20 mg/dL (ref 8–23)
CO2: 20 mmol/L — ABNORMAL LOW (ref 22–32)
Calcium: 9.2 mg/dL (ref 8.9–10.3)
Chloride: 106 mmol/L (ref 98–111)
Creatinine, Ser: 1.18 mg/dL (ref 0.61–1.24)
GFR, Estimated: >60 mL/min (ref >60)
Glucose, Bld: 144 mg/dL — ABNORMAL HIGH (ref 70–99)
Potassium: 4.8 mmol/L (ref 3.5–5.1)
Sodium: 138 mmol/L (ref 135–145)

## 2020-11-07 NOTE — Progress Notes (Signed)
Sent text reminding pt to come in for lab work today.

## 2020-11-08 ENCOUNTER — Ambulatory Visit (HOSPITAL_BASED_OUTPATIENT_CLINIC_OR_DEPARTMENT_OTHER)
Admission: RE | Admit: 2020-11-08 | Discharge: 2020-11-08 | Disposition: A | Discharge status: Home / Self Care | Payer: Medicare PPO | Attending: Otolaryngology | Admitting: Otolaryngology

## 2020-11-08 ENCOUNTER — Ambulatory Visit (HOSPITAL_BASED_OUTPATIENT_CLINIC_OR_DEPARTMENT_OTHER): Payer: Medicare PPO | Admitting: Certified Registered"

## 2020-11-08 ENCOUNTER — Encounter (HOSPITAL_BASED_OUTPATIENT_CLINIC_OR_DEPARTMENT_OTHER): Payer: Self-pay | Admitting: Otolaryngology

## 2020-11-08 ENCOUNTER — Encounter (HOSPITAL_BASED_OUTPATIENT_CLINIC_OR_DEPARTMENT_OTHER)
Admission: RE | Disposition: A | Discharge status: Home / Self Care | Payer: Self-pay | Source: Home / Self Care | Attending: Otolaryngology

## 2020-11-08 ENCOUNTER — Other Ambulatory Visit: Payer: Self-pay

## 2020-11-08 DIAGNOSIS — J3489 Other specified disorders of nose and nasal sinuses: Secondary | ICD-10-CM | POA: Diagnosis not present

## 2020-11-08 DIAGNOSIS — Z6838 Body mass index (BMI) 38.0-38.9, adult: Secondary | ICD-10-CM | POA: Diagnosis not present

## 2020-11-08 DIAGNOSIS — E119 Type 2 diabetes mellitus without complications: Secondary | ICD-10-CM | POA: Diagnosis not present

## 2020-11-08 DIAGNOSIS — J338 Other polyp of sinus: Secondary | ICD-10-CM | POA: Diagnosis not present

## 2020-11-08 DIAGNOSIS — Z7984 Long term (current) use of oral hypoglycemic drugs: Secondary | ICD-10-CM | POA: Insufficient documentation

## 2020-11-08 DIAGNOSIS — J322 Chronic ethmoidal sinusitis: Secondary | ICD-10-CM | POA: Diagnosis not present

## 2020-11-08 DIAGNOSIS — J321 Chronic frontal sinusitis: Secondary | ICD-10-CM | POA: Diagnosis not present

## 2020-11-08 HISTORY — PX: ETHMOIDECTOMY: SHX5197

## 2020-11-08 HISTORY — PX: FRONTAL SINUS EXPLORATION: SHX6591

## 2020-11-08 HISTORY — PX: NASAL SINUS SURGERY: SHX719

## 2020-11-08 LAB — GLUCOSE, CAPILLARY
Glucose-Capillary: 166 mg/dL — ABNORMAL HIGH (ref 70–99)
Glucose-Capillary: 155 mg/dL — ABNORMAL HIGH (ref 70–99)

## 2020-11-08 SURGERY — ETHMOIDECTOMY
Anesthesia: General | Site: Nose | Laterality: Bilateral

## 2020-11-08 MED ORDER — FENTANYL CITRATE (PF) 100 MCG/2ML IJ SOLN
INTRAMUSCULAR | Status: AC
  Filled 2020-11-08: qty 2

## 2020-11-08 MED ORDER — MUPIROCIN 2 % EX OINT
TOPICAL_OINTMENT | CUTANEOUS | Status: DC | PRN
  Administered 2020-11-08: 1 via TOPICAL

## 2020-11-08 MED ORDER — ACETAMINOPHEN 10 MG/ML IV SOLN: 1000.0000 mg | Freq: Once | INTRAVENOUS | Status: DC | PRN

## 2020-11-08 MED ORDER — SUGAMMADEX SODIUM 500 MG/5ML IV SOLN
INTRAVENOUS | Status: AC
  Filled 2020-11-08: qty 5

## 2020-11-08 MED ORDER — FENTANYL CITRATE (PF) 100 MCG/2ML IJ SOLN
INTRAMUSCULAR | Status: DC | PRN
  Administered 2020-11-08: 100 ug via INTRAVENOUS

## 2020-11-08 MED ORDER — FENTANYL CITRATE (PF) 100 MCG/2ML IJ SOLN: 25.0000 ug | INTRAMUSCULAR | Status: DC | PRN

## 2020-11-08 MED ORDER — PROPOFOL 10 MG/ML IV BOLUS
INTRAVENOUS | Status: DC | PRN
  Administered 2020-11-08: 200 mg via INTRAVENOUS

## 2020-11-08 MED ORDER — AMOXICILLIN 875 MG PO TABS: 875.0000 mg | ORAL_TABLET | Freq: Two times a day (BID) | ORAL | 0 refills | Status: AC

## 2020-11-08 MED ORDER — OXYCODONE HCL 5 MG PO TABS
5.0000 mg | ORAL_TABLET | Freq: Once | ORAL | Status: DC | PRN
Start: 2020-11-08 — End: 2020-11-08

## 2020-11-08 MED ORDER — ONDANSETRON HCL 4 MG/2ML IJ SOLN
INTRAMUSCULAR | Status: AC
  Filled 2020-11-08: qty 2

## 2020-11-08 MED ORDER — ACETAMINOPHEN 160 MG/5ML PO SOLN: 325.0000 mg | ORAL | Status: DC | PRN

## 2020-11-08 MED ORDER — ONDANSETRON HCL 4 MG/2ML IJ SOLN: 4.0000 mg | Freq: Once | INTRAMUSCULAR | Status: DC | PRN

## 2020-11-08 MED ORDER — OXYMETAZOLINE HCL 0.05 % NA SOLN
NASAL | Status: AC
  Filled 2020-11-08: qty 30

## 2020-11-08 MED ORDER — PROPOFOL 10 MG/ML IV BOLUS
INTRAVENOUS | Status: AC
  Filled 2020-11-08: qty 20

## 2020-11-08 MED ORDER — ROCURONIUM BROMIDE 100 MG/10ML IV SOLN
INTRAVENOUS | Status: DC | PRN
  Administered 2020-11-08: 100 mg via INTRAVENOUS

## 2020-11-08 MED ORDER — PHENYLEPHRINE HCL (PRESSORS) 10 MG/ML IV SOLN
INTRAVENOUS | Status: DC | PRN
  Administered 2020-11-08 (×2): 40 ug via INTRAVENOUS

## 2020-11-08 MED ORDER — CEFAZOLIN SODIUM-DEXTROSE 2-4 GM/100ML-% IV SOLN
INTRAVENOUS | Status: AC
  Filled 2020-11-08: qty 100

## 2020-11-08 MED ORDER — CEFAZOLIN SODIUM-DEXTROSE 2-3 GM-%(50ML) IV SOLR
INTRAVENOUS | Status: DC | PRN
  Administered 2020-11-08: 2 g via INTRAVENOUS

## 2020-11-08 MED ORDER — LIDOCAINE 2% (20 MG/ML) 5 ML SYRINGE
INTRAMUSCULAR | Status: DC | PRN
  Administered 2020-11-08: 100 mg via INTRAVENOUS

## 2020-11-08 MED ORDER — OXYCODONE-ACETAMINOPHEN 5-325 MG PO TABS: 1.0000 | ORAL_TABLET | ORAL | 0 refills | Status: AC | PRN

## 2020-11-08 MED ORDER — EPHEDRINE 5 MG/ML INJ
INTRAVENOUS | Status: AC
  Filled 2020-11-08: qty 5

## 2020-11-08 MED ORDER — MIDAZOLAM HCL 5 MG/5ML IJ SOLN
INTRAMUSCULAR | Status: DC | PRN
  Administered 2020-11-08: 2 mg via INTRAVENOUS

## 2020-11-08 MED ORDER — ACETAMINOPHEN 325 MG PO TABS: 325.0000 mg | ORAL_TABLET | ORAL | Status: DC | PRN

## 2020-11-08 MED ORDER — DEXAMETHASONE SODIUM PHOSPHATE 10 MG/ML IJ SOLN
INTRAMUSCULAR | Status: AC
  Filled 2020-11-08: qty 1

## 2020-11-08 MED ORDER — LACTATED RINGERS IV SOLN: INTRAVENOUS | Status: DC

## 2020-11-08 MED ORDER — MIDAZOLAM HCL 2 MG/2ML IJ SOLN
INTRAMUSCULAR | Status: AC
  Filled 2020-11-08: qty 2

## 2020-11-08 MED ORDER — ROCURONIUM BROMIDE 10 MG/ML (PF) SYRINGE
PREFILLED_SYRINGE | INTRAVENOUS | Status: AC
  Filled 2020-11-08: qty 10

## 2020-11-08 MED ORDER — MEPERIDINE HCL 25 MG/ML IJ SOLN: 6.2500 mg | INTRAMUSCULAR | Status: DC | PRN

## 2020-11-08 MED ORDER — SUGAMMADEX SODIUM 500 MG/5ML IV SOLN
INTRAVENOUS | Status: DC | PRN
  Administered 2020-11-08: 500 mg via INTRAVENOUS

## 2020-11-08 MED ORDER — DEXAMETHASONE SODIUM PHOSPHATE 10 MG/ML IJ SOLN
INTRAMUSCULAR | Status: DC | PRN
  Administered 2020-11-08: 5 mg via INTRAVENOUS

## 2020-11-08 MED ORDER — PHENYLEPHRINE 40 MCG/ML (10ML) SYRINGE FOR IV PUSH (FOR BLOOD PRESSURE SUPPORT)
PREFILLED_SYRINGE | INTRAVENOUS | Status: AC
  Filled 2020-11-08: qty 10

## 2020-11-08 MED ORDER — LIDOCAINE 2% (20 MG/ML) 5 ML SYRINGE
INTRAMUSCULAR | Status: AC
  Filled 2020-11-08: qty 5

## 2020-11-08 MED ORDER — OXYMETAZOLINE HCL 0.05 % NA SOLN
NASAL | Status: DC | PRN
  Administered 2020-11-08: 1 via TOPICAL

## 2020-11-08 MED ORDER — OXYCODONE HCL 5 MG/5ML PO SOLN
5.0000 mg | Freq: Once | ORAL | Status: DC | PRN
Start: 2020-11-08 — End: 2020-11-08

## 2020-11-08 MED ORDER — EPHEDRINE SULFATE 50 MG/ML IJ SOLN
INTRAMUSCULAR | Status: DC | PRN
  Administered 2020-11-08: 5 mg via INTRAVENOUS
  Administered 2020-11-08: 15 mg via INTRAVENOUS
  Administered 2020-11-08 (×3): 10 mg via INTRAVENOUS

## 2020-11-08 MED ORDER — ONDANSETRON HCL 4 MG/2ML IJ SOLN
INTRAMUSCULAR | Status: DC | PRN
  Administered 2020-11-08: 4 mg via INTRAVENOUS

## 2020-11-08 SURGICAL SUPPLY — 47 items
BLADE RAD40 ROTATE 4M 4 5PK (BLADE) IMPLANT
BLADE RAD60 ROTATE M4 4 5PK (BLADE) IMPLANT
BLADE ROTATE RAD 12 4 M4 (BLADE) IMPLANT
BLADE ROTATE RAD 40 4 M4 (BLADE) IMPLANT
BLADE ROTATE TRICUT 4X13 M4 (BLADE) ×3 IMPLANT
BLADE TRICUT ROTATE M4 4 5PK (BLADE) IMPLANT
BUR HS RAD FRONTAL 3 (BURR) IMPLANT
CANISTER SUC SOCK COL 7IN (MISCELLANEOUS) ×3 IMPLANT
CANISTER SUCT 1200ML W/VALVE (MISCELLANEOUS) ×6 IMPLANT
COAGULATOR SUCT 8FR VV (MISCELLANEOUS) IMPLANT
DECANTER SPIKE VIAL GLASS SM (MISCELLANEOUS) IMPLANT
DEFOGGER MIRROR 1QT (MISCELLANEOUS) ×3 IMPLANT
DRSG NASAL KENNEDY LMNT 8CM (GAUZE/BANDAGES/DRESSINGS) IMPLANT
DRSG NASOPORE 8CM (GAUZE/BANDAGES/DRESSINGS) ×3 IMPLANT
DRSG TELFA 3X8 NADH (GAUZE/BANDAGES/DRESSINGS) IMPLANT
ELECT REM PT RETURN 9FT ADLT (ELECTROSURGICAL) ×3
ELECTRODE REM PT RTRN 9FT ADLT (ELECTROSURGICAL) ×2 IMPLANT
GLOVE SURG ENC MOIS LTX SZ7.5 (GLOVE) ×3 IMPLANT
GLOVE SURG POLYISO LF SZ6.5 (GLOVE) ×3 IMPLANT
GLOVE SURG UNDER POLY LF SZ7 (GLOVE) ×3 IMPLANT
GOWN STRL REUS W/ TWL LRG LVL3 (GOWN DISPOSABLE) ×4 IMPLANT
GOWN STRL REUS W/TWL LRG LVL3 (GOWN DISPOSABLE) ×6
HEMOSTAT SURGICEL 2X14 (HEMOSTASIS) IMPLANT
IV NS 500ML (IV SOLUTION) ×3
IV NS 500ML BAXH (IV SOLUTION) ×2 IMPLANT
NEEDLE HYPO 25X1 1.5 SAFETY (NEEDLE) IMPLANT
NEEDLE SPNL 25GX3.5 QUINCKE BL (NEEDLE) IMPLANT
NS IRRIG 1000ML POUR BTL (IV SOLUTION) ×3 IMPLANT
PACK BASIN DAY SURGERY FS (CUSTOM PROCEDURE TRAY) ×3 IMPLANT
PACK ENT DAY SURGERY (CUSTOM PROCEDURE TRAY) ×3 IMPLANT
SLEEVE SCD COMPRESS KNEE MED (STOCKING) ×3 IMPLANT
SPLINT NASAL AIRWAY SILICONE (MISCELLANEOUS) IMPLANT
SPONGE GAUZE 2X2 8PLY STRL LF (GAUZE/BANDAGES/DRESSINGS) ×3 IMPLANT
SPONGE NEURO XRAY DETECT 1X3 (DISPOSABLE) ×3 IMPLANT
SUCTION FRAZIER HANDLE 10FR (MISCELLANEOUS)
SUCTION TUBE FRAZIER 10FR DISP (MISCELLANEOUS) IMPLANT
SUT CHROMIC 4 0 P 3 18 (SUTURE) IMPLANT
SUT PLAIN 4 0 ~~LOC~~ 1 (SUTURE) IMPLANT
SUT PROLENE 3 0 PS 2 (SUTURE) IMPLANT
SYR 50ML LL SCALE MARK (SYRINGE) IMPLANT
TOWEL GREEN STERILE FF (TOWEL DISPOSABLE) ×3 IMPLANT
TRACKER ENT INSTRUMENT (MISCELLANEOUS) ×3 IMPLANT
TRACKER ENT PATIENT (MISCELLANEOUS) ×3 IMPLANT
TUBE CONNECTING 20X1/4 (TUBING) ×3 IMPLANT
TUBE SALEM SUMP 16 FR W/ARV (TUBING) IMPLANT
TUBING STRAIGHTSHOT EPS 5PK (TUBING) ×3 IMPLANT
YANKAUER SUCT BULB TIP NO VENT (SUCTIONS) ×3 IMPLANT

## 2020-11-08 NOTE — Anesthesia Preprocedure Evaluation (Addendum)
Anesthesia Evaluation  Patient identified by MRN, date of birth, ID band Patient awake    Reviewed: Allergy & Precautions, NPO status , Patient's Chart, lab work & pertinent test results, reviewed documented beta blocker date and time   History of Anesthesia Complications Negative for: history of anesthetic complications  Airway Mallampati: III   Neck ROM: Full    Dental  (+) Dental Advisory Given, Teeth Intact   Pulmonary neg pulmonary ROS,    Pulmonary exam normal        Cardiovascular hypertension, Pt. on medications and Pt. on home beta blockers (-) angina(-) Past MI and (-) CHF Normal cardiovascular exam     Neuro/Psych negative neurological ROS  negative psych ROS   GI/Hepatic negative GI ROS, Neg liver ROS,   Endo/Other  diabetes, Type 2, Oral Hypoglycemic AgentsMorbid obesity  Renal/GU   negative genitourinary   Musculoskeletal negative musculoskeletal ROS (+)   Abdominal (+) + obese,   Peds  Hematology negative hematology ROS (+)   Anesthesia Other Findings   Reproductive/Obstetrics                             Anesthesia Physical  Anesthesia Plan  ASA: III  Anesthesia Plan: General   Post-op Pain Management:    Induction: Intravenous  PONV Risk Score and Plan: 2 and Ondansetron and Treatment may vary due to age or medical condition  Airway Management Planned: Oral ETT and Video Laryngoscope Planned  Additional Equipment: None  Intra-op Plan:   Post-operative Plan: Extubation in OR  Informed Consent: I have reviewed the patients History and Physical, chart, labs and discussed the procedure including the risks, benefits and alternatives for the proposed anesthesia with the patient or authorized representative who has indicated his/her understanding and acceptance.     Dental advisory given  Plan Discussed with: CRNA  Anesthesia Plan Comments:         Anesthesia Quick Evaluation

## 2020-11-08 NOTE — Transfer of Care (Signed)
Immediate Anesthesia Transfer of Care Note  Patient: Andrew Ho  Procedure(s) Performed: BILATERAL ENDOSCOPIC ETHMOIDECTOMY (Bilateral: Nose) BILATERAL FRONTAL RECESS SINUS EXPLORATION (Bilateral: Nose) ENDOSCOPIC SINUS SURGERY WITH STEALTH NAVIGATION (Bilateral: Nose)  Patient Location: PACU  Anesthesia Type:General  Level of Consciousness: awake, alert  and oriented  Airway & Oxygen Therapy: Patient Spontanous Breathing and Patient connected to face mask oxygen  Post-op Assessment: Report given to RN and Post -op Vital signs reviewed and stable  Post vital signs: Reviewed and stable  Last Vitals:  Vitals Value Taken Time  BP 164/84 11/08/20 1206  Temp    Pulse 50 11/08/20 1208  Resp 14 11/08/20 1208  SpO2 95 % 11/08/20 1208  Vitals shown include unvalidated device data.  Last Pain:  Vitals:   11/08/20 0948  TempSrc: Oral  PainSc: 0-No pain         Complications: No notable events documented.

## 2020-11-08 NOTE — Anesthesia Procedure Notes (Signed)
Procedure Name: Intubation Date/Time: 11/08/2020 11:10 AM Performed by: Lavonia Dana, CRNA Pre-anesthesia Checklist: Patient identified, Emergency Drugs available, Suction available and Patient being monitored Patient Re-evaluated:Patient Re-evaluated prior to induction Oxygen Delivery Method: Circle system utilized Preoxygenation: Pre-oxygenation with 100% oxygen Induction Type: IV induction Ventilation: Mask ventilation without difficulty and Oral airway inserted - appropriate to patient size Laryngoscope Size: Glidescope and 4 Grade View: Grade I Tube type: Oral Tube size: 7.5 mm Number of attempts: 1 Airway Equipment and Method: Oral airway, Video-laryngoscopy and Rigid stylet Placement Confirmation: ETT inserted through vocal cords under direct vision, positive ETCO2 and breath sounds checked- equal and bilateral Secured at: 25 cm Tube secured with: Tape Dental Injury: Teeth and Oropharynx as per pre-operative assessment

## 2020-11-08 NOTE — H&P (Signed)
Cc: Chronic nasal obstruction, sinonasal polyps  HPI: The patient is a 75 year old male who returns today for his follow-up evaluation. The patient was previously seen 1 month ago.  At that time, he was noted to have a large left nasal polyp, completely obstructing his left nasal passageway.  He was also noted to have diffuse nasal mucosal congestion.  The patient has been having significant difficulty breathing through his nose for several months.  He was treated with Flonase nasal spray with no improvement in his symptoms.  The patient could not tolerate systemic Prednisone due to his diabetes.  The patient subsequently underwent a sinus CT scan.  The CT showed polypoid tissue completely obstructing the right frontal recess and severely narrowed the left frontal recess.  Polypoid tissue was noted within his bilateral frontal and ethmoid sinuses.  His osteomeatal complexes were patent bilaterally.  The patient returns today reporting no improvement in his nasal obstruction.  He continues to have significant difficulty breathing through his nose, especially the left side.   Exam: General: Communicates without difficulty, well nourished, no acute distress. Head: Normocephalic, no evidence injury, no tenderness, facial buttresses intact without stepoff. Eyes: PERRL, EOMI. No scleral icterus, conjunctivae clear. Neuro: CN II exam reveals vision grossly intact.  No nystagmus at any point of gaze. Ears: Auricles well formed without lesions.  Ear canals are intact without mass or lesion.  No erythema or edema is appreciated.  The TMs are intact without fluid. Nose: External evaluation reveals normal support and skin without lesions.  Dorsum is intact.  Anterior rhinoscopy reveals congested and edematous mucosa over anterior aspect of the inferior turbinates and nasal septum. Large left nasal polyp. No purulence is noted. Middle meatus is not well visualized. Oral:  Oral cavity and oropharynx are intact, symmetric,  without erythema or edema.  Mucosa is moist without lesions. Neck: Full range of motion without pain.  There is no significant lymphadenopathy.  No masses palpable.  Thyroid bed within normal limits to palpation.  Parotid glands and submandibular glands equal bilaterally without mass.  Trachea is midline. Neuro:  CN 2-12 grossly intact. Gait normal. Vestibular: No nystagmus at any point of gaze. A flexible scope was inserted into the right nasal cavity.  Endoscopy of the interior nasal cavity, superior, inferior, and middle meatus was performed. The sphenoid-ethmoid recess was examined. Edematous mucosa was noted. Polypoid tissue was again noted.  Nasopharynx was clear.  Turbinates were hypertrophied but without mass. The procedure was repeated on the contralateral side with similar findings.    Assessment: 1.  Bilateral sinonasal polyps, involving bilateral frontal and ethmoid sinuses.  The right frontal recess is completely obstructed.  The left frontal recess is more than 80% obstructed.   2.  The left nasal cavity is 100% obstructed by the nasal polyps.   3.  The patient has not responded to allergy treatment and steroid nasal spray.    Plan: 1.  The nasal endoscopy findings and CT images are reviewed with the patient.  2.  Continue with Flonase nasal spray daily.   3.  In light of his persistent symptoms, he will benefit from undergoing bilateral endoscopic sinus surgery to remove the nasal polyps and to treat the frontal and ethmoid sinuses.  The risks, benefits, alternatives and details of the procedures are discussed with the patient.  Questions are invited and answered.  4.  The patient would like to proceed with the procedures.

## 2020-11-08 NOTE — Anesthesia Postprocedure Evaluation (Signed)
Anesthesia Post Note  Patient: Andrew Ho  Procedure(s) Performed: BILATERAL ENDOSCOPIC ETHMOIDECTOMY (Bilateral: Nose) BILATERAL FRONTAL RECESS SINUS EXPLORATION (Bilateral: Nose) ENDOSCOPIC SINUS SURGERY WITH STEALTH NAVIGATION (Bilateral: Nose)     Patient location during evaluation: Phase II Anesthesia Type: General Level of consciousness: awake Pain management: pain level controlled Vital Signs Assessment: post-procedure vital signs reviewed and stable Respiratory status: spontaneous breathing Cardiovascular status: stable Postop Assessment: no apparent nausea or vomiting Anesthetic complications: no   No notable events documented.  Last Vitals:  Vitals:   11/08/20 1245 11/08/20 1312  BP: (!) 166/82 (!) 168/69  Pulse: (!) 54 (!) 55  Resp: 16 16  Temp:  36.4 C  SpO2: 93% 95%    Last Pain:  Vitals:   11/08/20 1312  TempSrc:   PainSc: 0-No pain                 Huston Foley

## 2020-11-08 NOTE — Discharge Instructions (Addendum)

## 2020-11-08 NOTE — Op Note (Signed)
DATE OF PROCEDURE: 11/08/2020  OPERATIVE REPORT   SURGEON: Leta Baptist, MD   PREOPERATIVE DIAGNOSES:  1. 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 2. Chronic nasal obstruction.  POSTOPERATIVE DIAGNOSES:  1. 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 2. Chronic nasal obstruction.  PROCEDURE PERFORMED:  1.  Bilateral frontal sinusotomy and total ethmoidectomy with polyp removal. 2.  FUSION stereotactic image guidance.  ANESTHESIA: General endotracheal tube anesthesia.   COMPLICATIONS: None.   ESTIMATED BLOOD LOSS: 300 mL.   INDICATION FOR PROCEDURE: Andrew Ho is a 75 y.o. male with a history of chronic nasal obstruction and sinonasal polyps.  The patient has been having significant difficulty breathing through his nose for several months.  He was treated with antibiotics and Flonase nasal spray with no improvement in his symptoms.  The patient could not tolerate systemic Prednisone due to his diabetes.  The patient subsequently underwent a sinus CT scan.  The CT showed polypoid tissue completely obstructing the right frontal recess and severely narrowed the left frontal recess.  Polypoid tissue was noted within his bilateral frontal and ethmoid sinuses. Based on the above findings, the decision was made for the patient to undergo the above-stated procedures. The risks, benefits, alternatives, and details of the procedures were discussed with the patient. Questions were invited and answered. Informed consent was obtained.   DESCRIPTION OF PROCEDURE: The patient was taken to the operating room and placed supine on the operating table. General endotracheal tube anesthesia was administered by the anesthesiologist. The patient was positioned, and prepped and draped in the standard fashion for nasal surgery. Pledgets soaked with Afrin were placed in both nasal cavities for decongestion. The pledgets were subsequently removed. The FUSION stereotactic image  guidance marker was placed. The image guidance system was functional throughout the case.  Using a 0 endoscope, the left nasal cavity was examined.  The middle turbinate was medialized.  Polypoid tissue was noted within the middle meatus. The polypoid tissue was removed using a combination of microdebrider and Blakesley forceps.  Attention was first focused on the ethmoid sinuses. The bony partitions of the anterior and posterior ethmoid cavities were taken down. Polypoid tissue was noted and removed.  More polyps were noted to obstruct the left sphenoid opening. The polyps were removed. The sinuses were copiously irrigated with saline solution.  The same procedure was repeated on the right side without exception. More polyps were noted on the right side. All polyps were removed.   The care of the patient was turned over to the anesthesiologist. The patient was awakened from anesthesia without difficulty. The patient was extubated and transferred to the recovery room in good condition.   OPERATIVE FINDINGS: Bilateral chronic frontal and ethmoid sinusitis with polyposis.  SPECIMEN: Bilateral sinus contents.   FOLLOWUP CARE: The patient be discharged home once he is awake and alert. The patient will be placed on Percocet 1 tablets p.o. q.4 hours p.r.n. pain, and amoxicillin 875 mg p.o. b.i.d. for 53days. The patient will follow up in my office in 7 days for sinus debridement.  [name], MD

## 2020-11-09 LAB — SURGICAL PATHOLOGY

## 2020-11-10 ENCOUNTER — Encounter (HOSPITAL_BASED_OUTPATIENT_CLINIC_OR_DEPARTMENT_OTHER): Payer: Self-pay | Admitting: Otolaryngology

## 2020-11-15 DIAGNOSIS — J322 Chronic ethmoidal sinusitis: Secondary | ICD-10-CM | POA: Diagnosis not present

## 2020-11-15 DIAGNOSIS — J338 Other polyp of sinus: Secondary | ICD-10-CM | POA: Diagnosis not present

## 2020-11-15 DIAGNOSIS — J321 Chronic frontal sinusitis: Secondary | ICD-10-CM | POA: Diagnosis not present

## 2020-12-06 DIAGNOSIS — J338 Other polyp of sinus: Secondary | ICD-10-CM | POA: Diagnosis not present

## 2020-12-06 DIAGNOSIS — J321 Chronic frontal sinusitis: Secondary | ICD-10-CM | POA: Diagnosis not present

## 2020-12-06 DIAGNOSIS — J322 Chronic ethmoidal sinusitis: Secondary | ICD-10-CM | POA: Diagnosis not present

## 2020-12-28 DIAGNOSIS — E559 Vitamin D deficiency, unspecified: Secondary | ICD-10-CM | POA: Diagnosis not present

## 2020-12-28 DIAGNOSIS — E1169 Type 2 diabetes mellitus with other specified complication: Secondary | ICD-10-CM | POA: Diagnosis not present

## 2020-12-28 DIAGNOSIS — J321 Chronic frontal sinusitis: Secondary | ICD-10-CM | POA: Diagnosis not present

## 2020-12-28 DIAGNOSIS — I10 Essential (primary) hypertension: Secondary | ICD-10-CM | POA: Diagnosis not present

## 2020-12-28 DIAGNOSIS — Z125 Encounter for screening for malignant neoplasm of prostate: Secondary | ICD-10-CM | POA: Diagnosis not present

## 2020-12-28 DIAGNOSIS — J338 Other polyp of sinus: Secondary | ICD-10-CM | POA: Diagnosis not present

## 2020-12-28 DIAGNOSIS — E785 Hyperlipidemia, unspecified: Secondary | ICD-10-CM | POA: Diagnosis not present

## 2020-12-28 DIAGNOSIS — M109 Gout, unspecified: Secondary | ICD-10-CM | POA: Diagnosis not present

## 2020-12-28 DIAGNOSIS — J322 Chronic ethmoidal sinusitis: Secondary | ICD-10-CM | POA: Diagnosis not present

## 2021-01-04 DIAGNOSIS — E1169 Type 2 diabetes mellitus with other specified complication: Secondary | ICD-10-CM | POA: Diagnosis not present

## 2021-01-04 DIAGNOSIS — R7989 Other specified abnormal findings of blood chemistry: Secondary | ICD-10-CM | POA: Diagnosis not present

## 2021-01-04 DIAGNOSIS — Z0000 Encounter for general adult medical examination without abnormal findings: Secondary | ICD-10-CM | POA: Diagnosis not present

## 2021-01-04 DIAGNOSIS — M109 Gout, unspecified: Secondary | ICD-10-CM | POA: Diagnosis not present

## 2021-01-04 DIAGNOSIS — E559 Vitamin D deficiency, unspecified: Secondary | ICD-10-CM | POA: Diagnosis not present

## 2021-01-04 DIAGNOSIS — E785 Hyperlipidemia, unspecified: Secondary | ICD-10-CM | POA: Diagnosis not present

## 2021-01-04 DIAGNOSIS — R809 Proteinuria, unspecified: Secondary | ICD-10-CM | POA: Diagnosis not present

## 2021-01-04 DIAGNOSIS — I10 Essential (primary) hypertension: Secondary | ICD-10-CM | POA: Diagnosis not present

## 2021-01-05 DIAGNOSIS — E119 Type 2 diabetes mellitus without complications: Secondary | ICD-10-CM | POA: Diagnosis not present

## 2021-01-05 DIAGNOSIS — Z961 Presence of intraocular lens: Secondary | ICD-10-CM | POA: Diagnosis not present

## 2021-01-31 ENCOUNTER — Ambulatory Visit: Payer: Medicare PPO | Attending: Internal Medicine

## 2021-01-31 ENCOUNTER — Other Ambulatory Visit (HOSPITAL_BASED_OUTPATIENT_CLINIC_OR_DEPARTMENT_OTHER): Payer: Self-pay

## 2021-01-31 DIAGNOSIS — Z23 Encounter for immunization: Secondary | ICD-10-CM

## 2021-01-31 MED ORDER — PFIZER COVID-19 VAC BIVALENT 30 MCG/0.3ML IM SUSP
INTRAMUSCULAR | 0 refills | Status: AC
  Filled 2021-01-31: qty 0.3, 1d supply, fill #0

## 2021-01-31 NOTE — Progress Notes (Signed)
° °  Covid-19 Vaccination Clinic  Name:  Andrew Ho    MRN: [id number] DOB: [birth year]  01/31/2021  [name] was observed post Covid-19 immunization for 15 minutes without incident. He was provided with Vaccine Information Sheet and instruction to access the V-Safe system.   [name] was instructed to call 911 with any severe reactions post vaccine: Difficulty breathing  Swelling of face and throat  A fast heartbeat  A bad rash all over body  Dizziness and weakness   Immunizations Administered     Name Date Dose VIS Date Route   Pfizer Covid-19 Vaccine Bivalent Booster 01/31/2021  2:08 PM 0.3 mL 09/21/2020 Intramuscular   Manufacturer: [employer]   Lot: [id number]   [place]: [id number]

## 2021-06-27 DIAGNOSIS — J322 Chronic ethmoidal sinusitis: Secondary | ICD-10-CM | POA: Diagnosis not present

## 2021-06-27 DIAGNOSIS — J321 Chronic frontal sinusitis: Secondary | ICD-10-CM | POA: Diagnosis not present

## 2021-06-27 DIAGNOSIS — J338 Other polyp of sinus: Secondary | ICD-10-CM | POA: Diagnosis not present

## 2021-07-05 DIAGNOSIS — M109 Gout, unspecified: Secondary | ICD-10-CM | POA: Diagnosis not present

## 2021-07-05 DIAGNOSIS — Z1339 Encounter for screening examination for other mental health and behavioral disorders: Secondary | ICD-10-CM | POA: Diagnosis not present

## 2021-07-05 DIAGNOSIS — E559 Vitamin D deficiency, unspecified: Secondary | ICD-10-CM | POA: Diagnosis not present

## 2021-07-05 DIAGNOSIS — R7989 Other specified abnormal findings of blood chemistry: Secondary | ICD-10-CM | POA: Diagnosis not present

## 2021-07-05 DIAGNOSIS — I10 Essential (primary) hypertension: Secondary | ICD-10-CM | POA: Diagnosis not present

## 2021-07-05 DIAGNOSIS — E1169 Type 2 diabetes mellitus with other specified complication: Secondary | ICD-10-CM | POA: Diagnosis not present

## 2021-07-05 DIAGNOSIS — R809 Proteinuria, unspecified: Secondary | ICD-10-CM | POA: Diagnosis not present

## 2021-07-05 DIAGNOSIS — E785 Hyperlipidemia, unspecified: Secondary | ICD-10-CM | POA: Diagnosis not present

## 2021-07-14 DIAGNOSIS — C61 Malignant neoplasm of prostate: Secondary | ICD-10-CM | POA: Diagnosis not present

## 2021-07-19 DIAGNOSIS — K5730 Diverticulosis of large intestine without perforation or abscess without bleeding: Secondary | ICD-10-CM | POA: Diagnosis not present

## 2021-07-19 DIAGNOSIS — Z1211 Encounter for screening for malignant neoplasm of colon: Secondary | ICD-10-CM | POA: Diagnosis not present

## 2021-07-19 DIAGNOSIS — D123 Benign neoplasm of transverse colon: Secondary | ICD-10-CM | POA: Diagnosis not present

## 2021-07-21 DIAGNOSIS — C61 Malignant neoplasm of prostate: Secondary | ICD-10-CM | POA: Diagnosis not present

## 2021-07-21 DIAGNOSIS — D123 Benign neoplasm of transverse colon: Secondary | ICD-10-CM | POA: Diagnosis not present

## 2021-08-08 DIAGNOSIS — Z1331 Encounter for screening for depression: Secondary | ICD-10-CM | POA: Diagnosis not present

## 2021-08-08 DIAGNOSIS — E785 Hyperlipidemia, unspecified: Secondary | ICD-10-CM | POA: Diagnosis not present

## 2021-08-08 DIAGNOSIS — E1169 Type 2 diabetes mellitus with other specified complication: Secondary | ICD-10-CM | POA: Diagnosis not present

## 2021-08-08 DIAGNOSIS — Z1339 Encounter for screening examination for other mental health and behavioral disorders: Secondary | ICD-10-CM | POA: Diagnosis not present

## 2021-08-08 DIAGNOSIS — I10 Essential (primary) hypertension: Secondary | ICD-10-CM | POA: Diagnosis not present

## 2021-08-08 DIAGNOSIS — M109 Gout, unspecified: Secondary | ICD-10-CM | POA: Diagnosis not present

## 2021-08-08 DIAGNOSIS — R809 Proteinuria, unspecified: Secondary | ICD-10-CM | POA: Diagnosis not present

## 2021-08-08 DIAGNOSIS — R7989 Other specified abnormal findings of blood chemistry: Secondary | ICD-10-CM | POA: Diagnosis not present

## 2021-09-05 DIAGNOSIS — N529 Male erectile dysfunction, unspecified: Secondary | ICD-10-CM | POA: Diagnosis not present

## 2021-09-05 DIAGNOSIS — Z6834 Body mass index (BMI) 34.0-34.9, adult: Secondary | ICD-10-CM | POA: Diagnosis not present

## 2021-09-05 DIAGNOSIS — M109 Gout, unspecified: Secondary | ICD-10-CM | POA: Diagnosis not present

## 2021-09-05 DIAGNOSIS — E669 Obesity, unspecified: Secondary | ICD-10-CM | POA: Diagnosis not present

## 2021-09-05 DIAGNOSIS — E785 Hyperlipidemia, unspecified: Secondary | ICD-10-CM | POA: Diagnosis not present

## 2021-09-05 DIAGNOSIS — Z7984 Long term (current) use of oral hypoglycemic drugs: Secondary | ICD-10-CM | POA: Diagnosis not present

## 2021-09-05 DIAGNOSIS — Z809 Family history of malignant neoplasm, unspecified: Secondary | ICD-10-CM | POA: Diagnosis not present

## 2021-09-05 DIAGNOSIS — I10 Essential (primary) hypertension: Secondary | ICD-10-CM | POA: Diagnosis not present

## 2021-09-05 DIAGNOSIS — E119 Type 2 diabetes mellitus without complications: Secondary | ICD-10-CM | POA: Diagnosis not present

## 2021-12-12 ENCOUNTER — Other Ambulatory Visit (HOSPITAL_BASED_OUTPATIENT_CLINIC_OR_DEPARTMENT_OTHER): Payer: Self-pay

## 2021-12-12 MED ORDER — COMIRNATY 30 MCG/0.3ML IM SUSY
PREFILLED_SYRINGE | INTRAMUSCULAR | 0 refills | Status: AC
  Filled 2021-12-12: qty 0.3, 1d supply, fill #0

## 2021-12-26 DIAGNOSIS — J322 Chronic ethmoidal sinusitis: Secondary | ICD-10-CM | POA: Diagnosis not present

## 2021-12-26 DIAGNOSIS — J321 Chronic frontal sinusitis: Secondary | ICD-10-CM | POA: Diagnosis not present

## 2021-12-26 DIAGNOSIS — J338 Other polyp of sinus: Secondary | ICD-10-CM | POA: Diagnosis not present

## 2022-01-03 DIAGNOSIS — Z125 Encounter for screening for malignant neoplasm of prostate: Secondary | ICD-10-CM | POA: Diagnosis not present

## 2022-01-03 DIAGNOSIS — E1169 Type 2 diabetes mellitus with other specified complication: Secondary | ICD-10-CM | POA: Diagnosis not present

## 2022-01-03 DIAGNOSIS — E785 Hyperlipidemia, unspecified: Secondary | ICD-10-CM | POA: Diagnosis not present

## 2022-01-03 DIAGNOSIS — M109 Gout, unspecified: Secondary | ICD-10-CM | POA: Diagnosis not present

## 2022-01-03 DIAGNOSIS — I10 Essential (primary) hypertension: Secondary | ICD-10-CM | POA: Diagnosis not present

## 2022-01-03 DIAGNOSIS — R7989 Other specified abnormal findings of blood chemistry: Secondary | ICD-10-CM | POA: Diagnosis not present

## 2022-01-03 DIAGNOSIS — E559 Vitamin D deficiency, unspecified: Secondary | ICD-10-CM | POA: Diagnosis not present

## 2022-01-09 DIAGNOSIS — Z961 Presence of intraocular lens: Secondary | ICD-10-CM | POA: Diagnosis not present

## 2022-01-09 DIAGNOSIS — E119 Type 2 diabetes mellitus without complications: Secondary | ICD-10-CM | POA: Diagnosis not present

## 2022-01-10 DIAGNOSIS — Z1331 Encounter for screening for depression: Secondary | ICD-10-CM | POA: Diagnosis not present

## 2022-01-10 DIAGNOSIS — E1169 Type 2 diabetes mellitus with other specified complication: Secondary | ICD-10-CM | POA: Diagnosis not present

## 2022-01-10 DIAGNOSIS — R809 Proteinuria, unspecified: Secondary | ICD-10-CM | POA: Diagnosis not present

## 2022-01-10 DIAGNOSIS — E785 Hyperlipidemia, unspecified: Secondary | ICD-10-CM | POA: Diagnosis not present

## 2022-01-10 DIAGNOSIS — Z0000 Encounter for general adult medical examination without abnormal findings: Secondary | ICD-10-CM | POA: Diagnosis not present

## 2022-01-10 DIAGNOSIS — E559 Vitamin D deficiency, unspecified: Secondary | ICD-10-CM | POA: Diagnosis not present

## 2022-01-10 DIAGNOSIS — I10 Essential (primary) hypertension: Secondary | ICD-10-CM | POA: Diagnosis not present

## 2022-01-10 DIAGNOSIS — M109 Gout, unspecified: Secondary | ICD-10-CM | POA: Diagnosis not present

## 2022-06-13 DIAGNOSIS — H02844 Edema of left upper eyelid: Secondary | ICD-10-CM | POA: Diagnosis not present

## 2022-06-13 DIAGNOSIS — H16142 Punctate keratitis, left eye: Secondary | ICD-10-CM | POA: Diagnosis not present

## 2022-06-25 DIAGNOSIS — J322 Chronic ethmoidal sinusitis: Secondary | ICD-10-CM | POA: Diagnosis not present

## 2022-06-25 DIAGNOSIS — J338 Other polyp of sinus: Secondary | ICD-10-CM | POA: Diagnosis not present

## 2022-06-25 DIAGNOSIS — J321 Chronic frontal sinusitis: Secondary | ICD-10-CM | POA: Diagnosis not present

## 2022-07-02 DIAGNOSIS — H16142 Punctate keratitis, left eye: Secondary | ICD-10-CM | POA: Diagnosis not present

## 2022-07-03 DIAGNOSIS — I10 Essential (primary) hypertension: Secondary | ICD-10-CM | POA: Diagnosis not present

## 2022-07-03 DIAGNOSIS — R7989 Other specified abnormal findings of blood chemistry: Secondary | ICD-10-CM | POA: Diagnosis not present

## 2022-07-03 DIAGNOSIS — R809 Proteinuria, unspecified: Secondary | ICD-10-CM | POA: Diagnosis not present

## 2022-07-03 DIAGNOSIS — E785 Hyperlipidemia, unspecified: Secondary | ICD-10-CM | POA: Diagnosis not present

## 2022-07-03 DIAGNOSIS — E1169 Type 2 diabetes mellitus with other specified complication: Secondary | ICD-10-CM | POA: Diagnosis not present

## 2022-07-03 DIAGNOSIS — E559 Vitamin D deficiency, unspecified: Secondary | ICD-10-CM | POA: Diagnosis not present

## 2022-07-03 DIAGNOSIS — M109 Gout, unspecified: Secondary | ICD-10-CM | POA: Diagnosis not present

## 2022-07-17 DIAGNOSIS — C61 Malignant neoplasm of prostate: Secondary | ICD-10-CM | POA: Diagnosis not present

## 2022-07-22 IMAGING — CT CT MAXILLOFACIAL W/O CM
3 of 5 series · 14 of 47 positions shown, 16 images · non-contrast
Comparison: None.

CLINICAL DATA: Nasal polyps

EXAM:
CT MAXILLOFACIAL WITHOUT CONTRAST
TECHNIQUE: Multidetector CT images of the paranasal sinuses were obtained using
the standard protocol without intravenous contrast.

[Series 4: sinus 2.00 hr60 s3 cor bone · coronal · 0.26mm/px · 3 of 95 slices shown]
[im 32/95  bone]
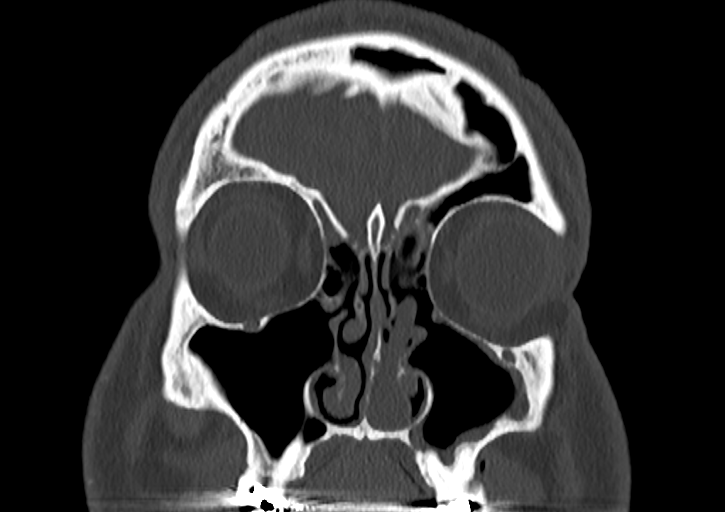
[im 42/95  bone]
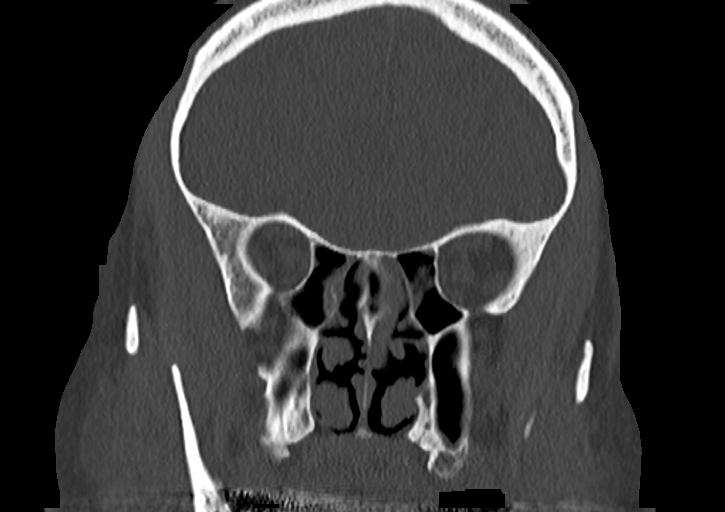
[im 53/95  bone]
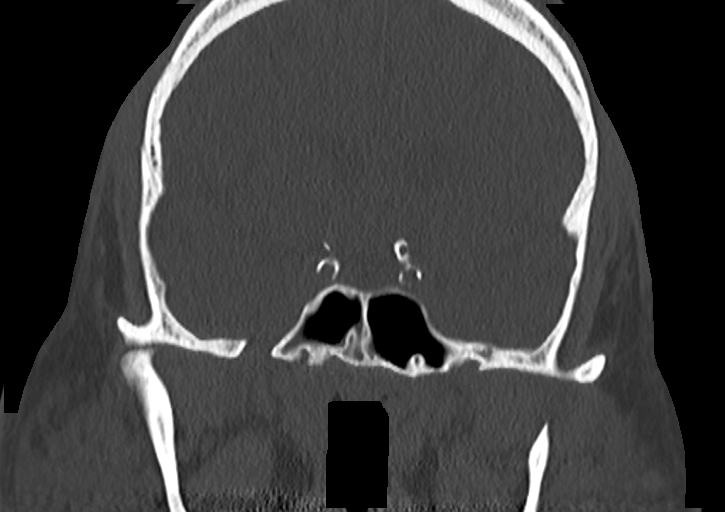

[Series 6: sinus 2.00 hr60 s3 sag bone · sagittal · 0.26mm/px · 3 of 95 slices shown]
[im 32/95  bone]
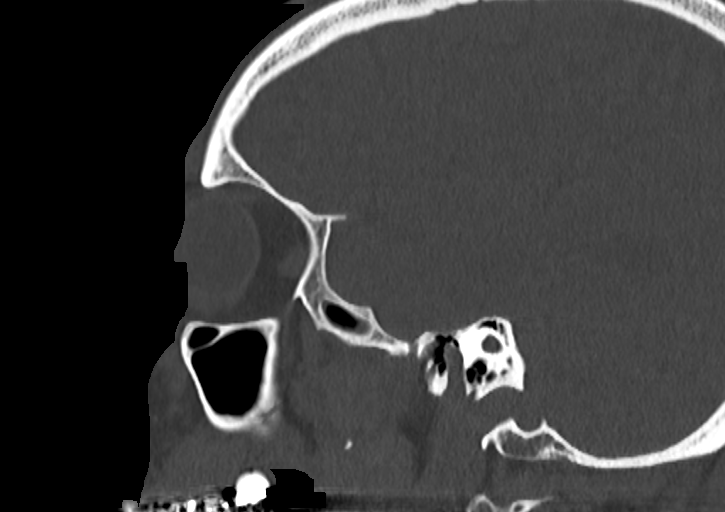
[im 48/95  bone]
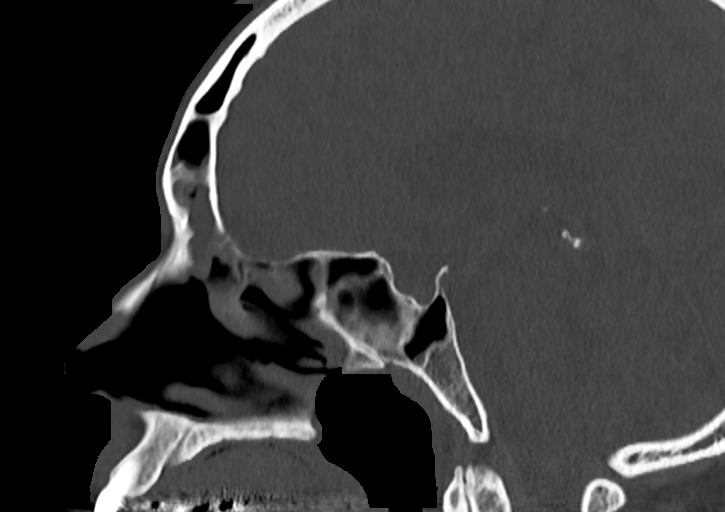
[im 63/95  bone]
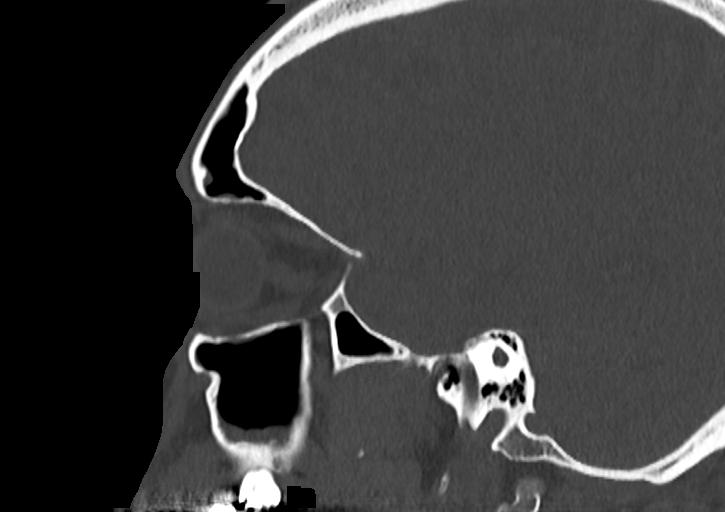

[Series 10: sinus 0.60 hr60 s3 axial fusion thins · axial · 0.37mm/px · z∈[-668,-564]mm · 8 of 224 slices shown, 10 images]
[im 25/224  brain]
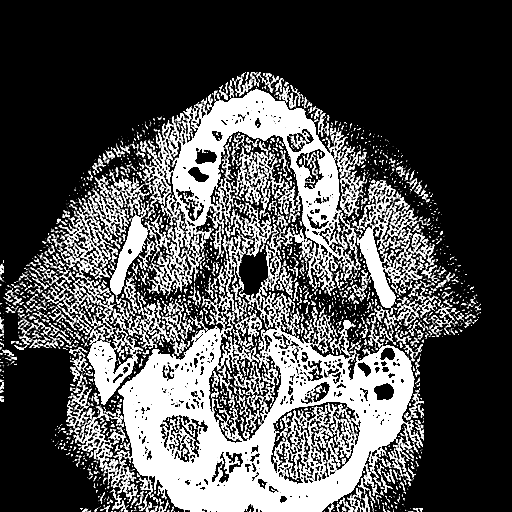
[im 25/224  bone]
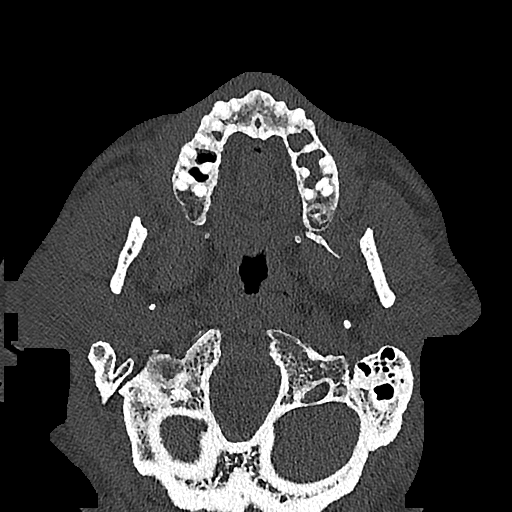
[im 50/224  bone]
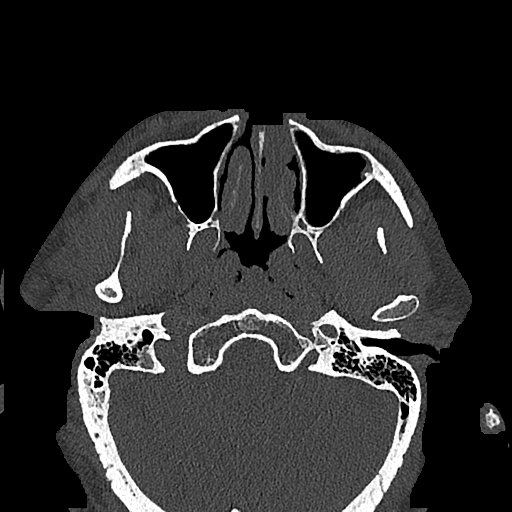
[im 75/224  bone]
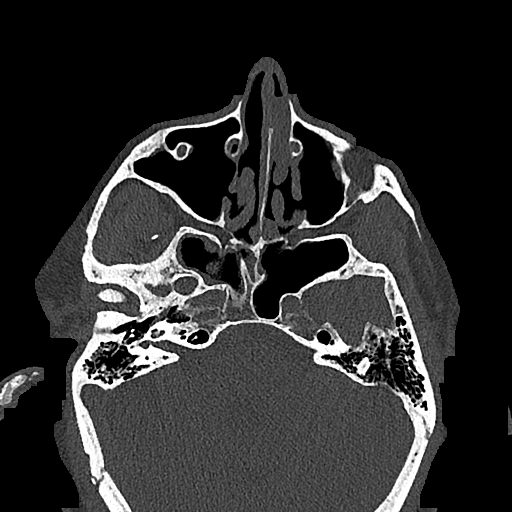
[im 100/224  bone]
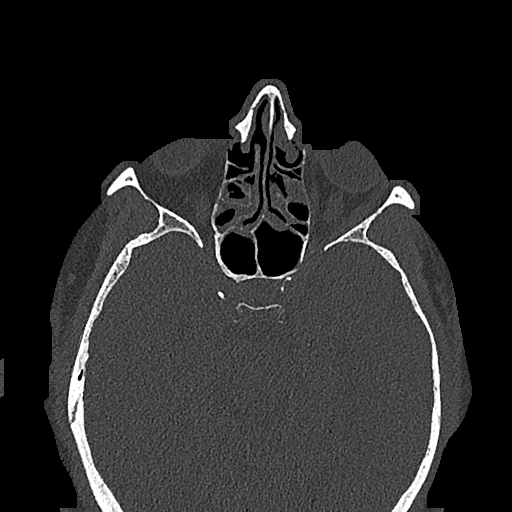
[im 124/224  brain]
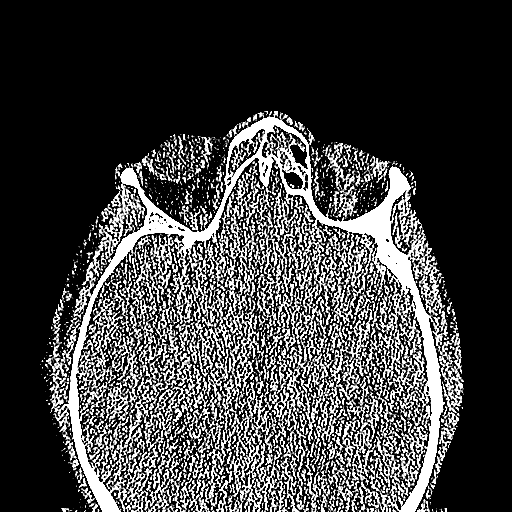
[im 124/224  bone]
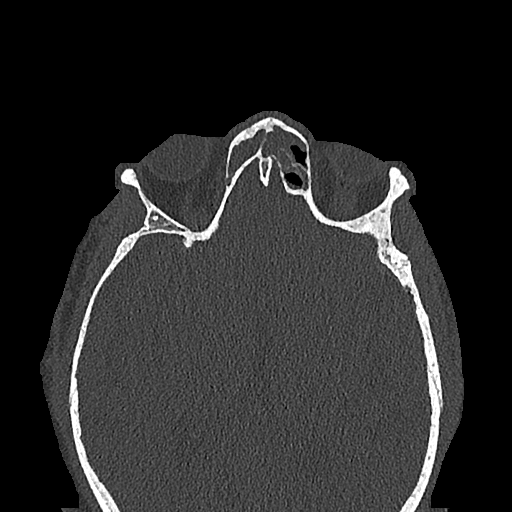
[im 149/224  bone]
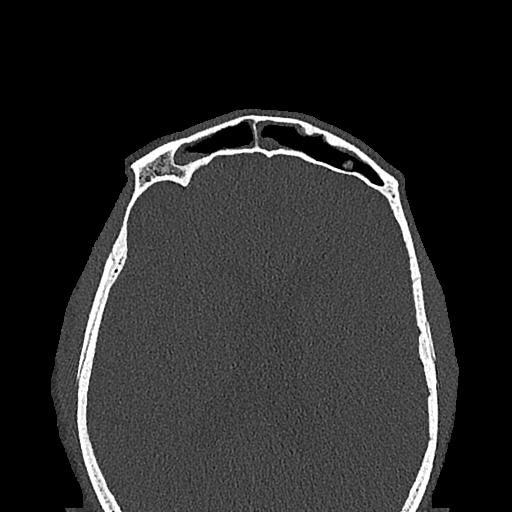
[im 174/224  bone]
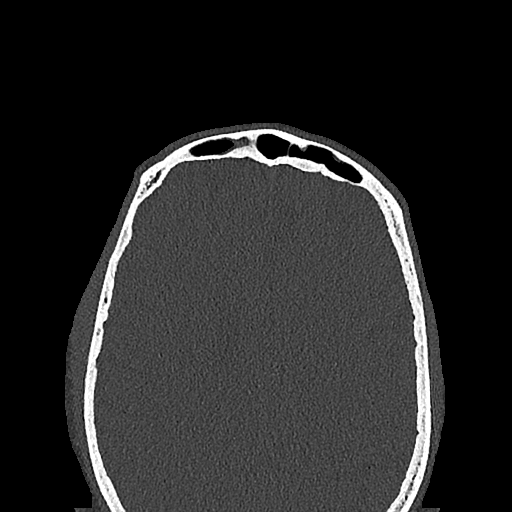
[im 199/224  bone]
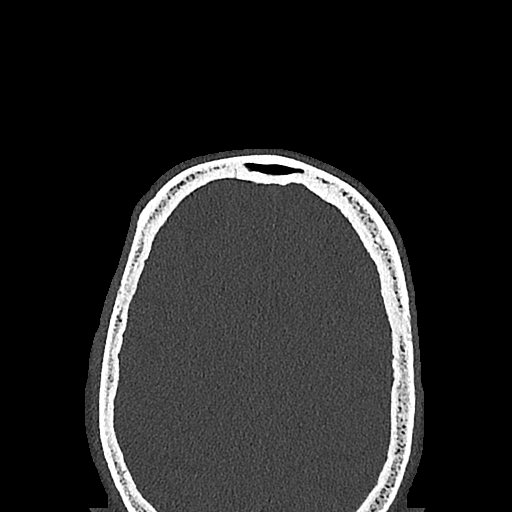

[14 of 47 positions shown; findings below may reference images not displayed]

FINDINGS: Paranasal sinuses:

Frontal: Mild bilateral mucosal thickening. Right frontal recess is
occluded. The left is narrowed.

Ethmoid: Mild mucosal thickening

Maxillary: Mild left maxillary sinus mucosal thickening.

Sphenoid: Mild bilateral mucosal thickening. Narrowing of both
sphenoethmoidal recesses.

Right ostiomeatal unit: Patent.

Left ostiomeatal unit: Patent.

Nasal passages: Patent. Mild leftward septal deviation

Anatomy: No pneumatization superior to anterior ethmoid notches.
Keros III (8-16mm). Sellar sphenoid pneumatization pattern. No
dehiscence of carotid or optic canals. No onodi cell.

Other: Orbits and intracranial compartment are unremarkable. Visible
mastoid air cells are normally aerated.
IMPRESSION: Mild mucosal thickening of the paranasal sinuses with occlusion of
the right frontal recess and narrowing of the bilateral
sphenoethmoidal recesses.

## 2022-07-24 DIAGNOSIS — C61 Malignant neoplasm of prostate: Secondary | ICD-10-CM | POA: Diagnosis not present

## 2022-07-24 DIAGNOSIS — R8271 Bacteriuria: Secondary | ICD-10-CM | POA: Diagnosis not present

## 2022-10-09 ENCOUNTER — Ambulatory Visit: Payer: Medicare PPO | Admitting: Physician Assistant

## 2022-10-09 ENCOUNTER — Encounter: Payer: Self-pay | Admitting: Physician Assistant

## 2022-10-09 VITALS — BP 148/73 | HR 53 | Ht 68.0 in | Wt 233.0 lb

## 2022-10-09 DIAGNOSIS — E1169 Type 2 diabetes mellitus with other specified complication: Secondary | ICD-10-CM

## 2022-10-09 NOTE — Patient Instructions (Signed)
I encourage you to continue checking your blood sugar levels at home, keeping a written log and having available for all office visits.  I encourage you to avoid added sugars, cut back on flour products such as Ritz crackers and pie.  If you are not having any type of reduction in your blood sugar numbers, please feel free to follow-up with your primary care provider or return to the mobile unit.  Please also feel free to return to the mobile unit with your blood sugar meter and we will be happy to check it for you.  Roney Jaffe, PA-C Physician Assistant [facility] [email or web address]   Carbohydrate Counting for Diabetes Mellitus, Adult Carbohydrate counting is a method of keeping track of how many carbohydrates you eat. Eating carbohydrates increases the amount of sugar (glucose) in the blood. Counting how many carbohydrates you eat improves how well you manage your blood glucose. This, in turn, helps you manage your diabetes. Carbohydrates are measured in grams (g) per serving. It is important to know how many carbohydrates (in grams or by serving size) you can have in each meal. This is different for every person. A dietitian can help you make a meal plan and calculate how many carbohydrates you should have at each meal and snack. What foods contain carbohydrates? Carbohydrates are found in the following foods: Grains, such as breads and cereals. Dried beans and soy products. Starchy vegetables, such as potatoes, peas, and corn. Fruit and fruit juices. Milk and yogurt. Sweets and snack foods, such as cake, cookies, candy, chips, and soft drinks. How do I count carbohydrates in foods? There are two ways to count carbohydrates in food. You can read food labels or learn standard serving sizes of foods. You can use either of these methods or a combination of both. Using the Nutrition Facts label The Nutrition Facts list is included  on the labels of almost all packaged foods and beverages in the [place]. It includes: The serving size. Information about nutrients in each serving, including the grams of carbohydrate per serving. To use the Nutrition Facts, decide how many servings you will have. Then, multiply the number of servings by the number of carbohydrates per serving. The resulting number is the total grams of carbohydrates that you will be having. Learning the standard serving sizes of foods When you eat carbohydrate foods that are not packaged or do not include Nutrition Facts on the label, you need to measure the servings in order to count the grams of carbohydrates. Measure the foods that you will eat with a food scale or measuring cup, if needed. Decide how many standard-size servings you will eat. Multiply the number of servings by 15. For foods that contain carbohydrates, one serving equals 15 g of carbohydrates. For example, if you eat 2 cups or 10 oz (300 g) of strawberries, you will have eaten 2 servings and 30 g of carbohydrates (2 servings x 15 g = 30 g). For foods that have more than one food mixed, such as soups and casseroles, you must count the carbohydrates in each food that is included. The following list contains standard serving sizes of common carbohydrate-rich foods. Each of these servings has about 15 g of carbohydrates: 1 slice of bread. 1 six-inch (15 cm) tortilla. ? cup or 2 oz (53 g) cooked rice or pasta.  cup or 3 oz (85 g) cooked or canned, drained and rinsed beans or lentils.  cup or 3 oz (85 g) starchy  vegetable, such as peas, corn, or squash.  cup or 4 oz (120 g) hot cereal.  cup or 3 oz (85 g) boiled or mashed potatoes, or  or 3 oz (85 g) of a large baked potato.  cup or 4 fl oz (118 mL) fruit juice. 1 cup or 8 fl oz (237 mL) milk. 1 small or 4 oz (106 g) apple.  or 2 oz (63 g) of a medium banana. 1 cup or 5 oz (150 g) strawberries. 3 cups or 1 oz (28.3 g) popped  popcorn. What is an example of carbohydrate counting? To calculate the grams of carbohydrates in this sample meal, follow the steps shown below. Sample meal 3 oz (85 g) chicken breast. ? cup or 4 oz (106 g) brown rice.  cup or 3 oz (85 g) corn. 1 cup or 8 fl oz (237 mL) milk. 1 cup or 5 oz (150 g) strawberries with sugar-free whipped topping. Carbohydrate calculation Identify the foods that contain carbohydrates: Rice. Corn. Milk. Strawberries. Calculate how many servings you have of each food: 2 servings rice. 1 serving corn. 1 serving milk. 1 serving strawberries. Multiply each number of servings by 15 g: 2 servings rice x 15 g = 30 g. 1 serving corn x 15 g = 15 g. 1 serving milk x 15 g = 15 g. 1 serving strawberries x 15 g = 15 g. Add together all of the amounts to find the total grams of carbohydrates eaten: 30 g + 15 g + 15 g + 15 g = 75 g of carbohydrates total. What are tips for following this plan? Shopping Develop a meal plan and then make a shopping list. Buy fresh and frozen vegetables, fresh and frozen fruit, dairy, eggs, beans, lentils, and whole grains. Look at food labels. Choose foods that have more fiber and less sugar. Avoid processed foods and foods with added sugars. Meal planning Aim to have the same number of grams of carbohydrates at each meal and for each snack time. Plan to have regular, balanced meals and snacks. Where to find more information American Diabetes Association: diabetes.org Centers for Disease Control and Prevention: TonerPromos.no Academy of Nutrition and Dietetics: eatright.org Association of Diabetes Care & Education Specialists: diabeteseducator.org Summary Carbohydrate counting is a method of keeping track of how many carbohydrates you eat. Eating carbohydrates increases the amount of sugar (glucose) in your blood. Counting how many carbohydrates you eat improves how well you manage your blood glucose. This helps you manage your  diabetes. A dietitian can help you make a meal plan and calculate how many carbohydrates you should have at each meal and snack. This information is not intended to replace advice given to you by your health care provider. Make sure you discuss any questions you have with your health care provider. Document Revised: 08/12/2019 Document Reviewed: 08/12/2019 Elsevier Patient Education  2024 [employer].

## 2022-10-09 NOTE — Progress Notes (Unsigned)
New Patient Office Visit  Subjective    Patient ID: Andrew Ho, male    DOB: [birth year]  Age: 77 y.o. MRN: [id number]  CC:  Chief Complaint  Patient presents with  . elevated glucose levels    Patient states he has had his glucose meter for a while, recently his fasting levels are increased and he has concerns   . Hypertension    HPI Andrew Ho  Lately BG readings - checks every other day 130 - 137- 139- has had some 149- 152  Yesterday - steak , green beans and corn , cabbage, tomatos, 1 bisucit and apple  One meal a day   Peanut butter and ritz for snacks  Water - soft drink zero - Working on eating healthier       Outpatient Encounter Medications as of 10/09/2022  Medication Sig  . allopurinol (ZYLOPRIM) 300 MG tablet 300 MG BY MOUTH DAILY AFTER SUPPER  . atenolol (TENORMIN) 50 MG tablet Take 50 mg by mouth daily.  Marland Kitchen atorvastatin (LIPITOR) 20 MG tablet TAKE 20 MG BY MOUTH DAILY AT BEDTIME  . doxazosin (CARDURA) 2 MG tablet Take 2 mg by mouth daily.  . Glucose Blood (BLOOD GLUCOSE TEST STRIPS 333) STRP To check blood sugars In Vitro three times a day for 90 days  . Lancets Misc. (ACCU-CHEK FASTCLIX LANCET) KIT USE AS DIRECTED TO CHECK BLOOD SUGAR THREE TIMES DAILY  . losartan (COZAAR) 100 MG tablet   . metFORMIN (GLUCOPHAGE) 500 MG tablet [id number] MG BY MOUTH DAILY WITH MEALS  . Vitamin D, Ergocalciferol, (DRISDOL) 1.25 MG (50000 UNIT) CAPS capsule Take 1 capsule (50,000 Units total) by mouth every 7 (seven) days.  Marland Kitchen COVID-19 mRNA bivalent vaccine, Pfizer, (PFIZER COVID-19 VAC BIVALENT) injection Inject into the muscle. (Patient not taking: Reported on 10/09/2022)  . COVID-19 mRNA vaccine 2023-2024 (COMIRNATY) syringe Inject into the muscle. (Patient not taking: Reported on 10/09/2022)   No facility-administered encounter medications on file as of 10/09/2022.    Past Medical History:  Diagnosis Date  . Diabetes mellitus without complication (HCC)   .  Discoloration of skin since 08/2014   rt lower leg   . History of gout   . Hypertension   . Prostate cancer [facility])     Past Surgical History:  Procedure Laterality Date  . CATARACT EXTRACTION    . ETHMOIDECTOMY Bilateral 11/08/2020   Procedure: BILATERAL ENDOSCOPIC ETHMOIDECTOMY;  Surgeon: Newman Pies, MD;  Location: [facility];  Service: ENT;  Laterality: Bilateral;  . FRONTAL SINUS EXPLORATION Bilateral 11/08/2020   Procedure: BILATERAL FRONTAL RECESS SINUS EXPLORATION;  Surgeon: Newman Pies, MD;  Location: [facility];  Service: ENT;  Laterality: Bilateral;  . LYMPHADENECTOMY Bilateral 04/18/2015   Procedure: PELVIC LYMPHADENECTOMY;  Surgeon: Heloise Purpura, MD;  Location: [facility];  Service: Urology;  Laterality: Bilateral;  . NASAL SINUS SURGERY Bilateral 11/08/2020   Procedure: ENDOSCOPIC SINUS SURGERY WITH STEALTH NAVIGATION;  Surgeon: Newman Pies, MD;  Location: [facility];  Service: ENT;  Laterality: Bilateral;  . ROBOT ASSISTED LAPAROSCOPIC RADICAL PROSTATECTOMY N/A 04/18/2015   Procedure: XI ROBOTIC ASSISTED LAPAROSCOPIC RADICAL PROSTATECTOMY LEVEL 2;  Surgeon: Heloise Purpura, MD;  Location: [facility];  Service: Urology;  Laterality: N/A;    Family History  Problem Relation Age of Onset  . Dementia Father   . Heart failure Father   . Kidney disease Mother     Social History   Socioeconomic History  . Marital status: Married  Spouse name: Not on file  . Number of children: Not on file  . Years of education: Not on file  . Highest education level: Not on file  Occupational History  . Occupation: Retired  Tobacco Use  . Smoking status: Never  . Smokeless tobacco: Never  Substance and Sexual Activity  . Alcohol use: No  . Drug use: No  . Sexual activity: Not Currently  Other Topics Concern  . Not on file  Social History Narrative  . Not on file   Social Determinants of Health   Financial Resource Strain: Not on file  Food  Insecurity: Not on file  Transportation Needs: Not on file  Physical Activity: Not on file  Stress: Not on file  Social Connections: Not on file  Intimate Partner Violence: Not on file    ROS      Objective    BP (!) 148/73 (BP Location: Left Arm, Patient Position: Sitting, Cuff Size: Large)   Pulse (!) 53   Ht 5\' 8"  (1.727 m)   Wt 233 lb (105.7 kg)   SpO2 95%   BMI 35.43 kg/m   Physical Exam  {Labs (Optional):23779}    Assessment & Plan:   Problem List Items Addressed This Visit       Endocrine   Type 2 diabetes mellitus with other specified complication (HCC) - Primary    No follow-ups on file.   Kasandra Knudsen Mayers, PA-C

## 2022-10-10 ENCOUNTER — Encounter: Payer: Self-pay | Admitting: Physician Assistant

## 2022-10-17 DIAGNOSIS — I10 Essential (primary) hypertension: Secondary | ICD-10-CM | POA: Diagnosis not present

## 2022-10-17 DIAGNOSIS — E1169 Type 2 diabetes mellitus with other specified complication: Secondary | ICD-10-CM | POA: Diagnosis not present

## 2022-10-17 DIAGNOSIS — E785 Hyperlipidemia, unspecified: Secondary | ICD-10-CM | POA: Diagnosis not present

## 2022-11-12 ENCOUNTER — Other Ambulatory Visit (HOSPITAL_BASED_OUTPATIENT_CLINIC_OR_DEPARTMENT_OTHER): Payer: Self-pay

## 2022-11-12 MED ORDER — COMIRNATY 30 MCG/0.3ML IM SUSY
0.3000 mL | PREFILLED_SYRINGE | Freq: Once | INTRAMUSCULAR | 0 refills | Status: AC
  Filled 2022-11-12: qty 0.3, 1d supply, fill #0

## 2022-12-25 ENCOUNTER — Encounter (INDEPENDENT_AMBULATORY_CARE_PROVIDER_SITE_OTHER): Payer: Self-pay

## 2022-12-25 ENCOUNTER — Ambulatory Visit (INDEPENDENT_AMBULATORY_CARE_PROVIDER_SITE_OTHER): Payer: Medicare PPO

## 2022-12-25 VITALS — Ht 68.0 in | Wt 233.0 lb

## 2022-12-25 DIAGNOSIS — J310 Chronic rhinitis: Secondary | ICD-10-CM | POA: Diagnosis not present

## 2022-12-25 DIAGNOSIS — R0981 Nasal congestion: Secondary | ICD-10-CM

## 2022-12-25 DIAGNOSIS — J322 Chronic ethmoidal sinusitis: Secondary | ICD-10-CM

## 2022-12-25 DIAGNOSIS — J321 Chronic frontal sinusitis: Secondary | ICD-10-CM

## 2022-12-25 DIAGNOSIS — J338 Other polyp of sinus: Secondary | ICD-10-CM

## 2022-12-27 DIAGNOSIS — J322 Chronic ethmoidal sinusitis: Secondary | ICD-10-CM | POA: Insufficient documentation

## 2022-12-27 DIAGNOSIS — J321 Chronic frontal sinusitis: Secondary | ICD-10-CM | POA: Insufficient documentation

## 2022-12-27 DIAGNOSIS — J338 Other polyp of sinus: Secondary | ICD-10-CM | POA: Insufficient documentation

## 2022-12-27 NOTE — Progress Notes (Signed)
Patient ID: Andrew Ho, male   DOB: [birth year], 77 y.o.   MRN: [id number]  Follow up: Bilateral frontal and ethmoid polyps and sinusitis  HPI: The patient is a 77 year old male who returns today for his follow-up evaluation.  The patient has a history of chronic frontal and ethmoid sinusitis and polyposis.  He underwent bilateral endoscopic sinus surgery in October 2022.  The patient returns today reporting no difficulty over the past 6 months.  His nasal breathing has significantly improved since the procedure.  His sense of smell is good.  He reports only mild intermittent nasal congestion and sneezing spells during the allergy season.  He has been using Flonase nasal spray daily.  He has no other complaint today.  Exam: General: Communicates without difficulty, well nourished, no acute distress. Head: Normocephalic, no evidence injury, no tenderness, facial buttresses intact without stepoff. Face/sinus: No tenderness to palpation and percussion. Facial movement is normal and symmetric. Eyes: PERRL, EOMI. No scleral icterus, conjunctivae clear. Neuro: CN II exam reveals vision grossly intact.  No nystagmus at any point of gaze. Ears: Auricles well formed without lesions.  Ear canals are intact without mass or lesion.  No erythema or edema is appreciated.  The TMs are intact without fluid. Nose: External evaluation reveals normal support and skin without lesions.  Dorsum is intact.  Anterior rhinoscopy reveals congested mucosa over anterior aspect of inferior turbinates and intact septum.  No purulence noted. Oral:  Oral cavity and oropharynx are intact, symmetric, without erythema or edema.  Mucosa is moist without lesions. Neck: Full range of motion without pain.  There is no significant lymphadenopathy.  No masses palpable.  Thyroid bed within normal limits to palpation.  Parotid glands and submandibular glands equal bilaterally without mass.  Trachea is midline. Neuro:  CN 2-12 grossly intact.    Assessment: 1.  The patient is doing well status post bilateral endoscopic sinus surgery.  No recurrent infection is noted today. 2.  Chronic rhinitis with moderate nasal mucosal congestion.  Plan: 1.  The physical exam findings are reviewed with the patient. 2.  Continue Flonase nasal spray 2 sprays each nostril daily.  The importance of consistent daily use is discussed. 3.  The patient will return for reevaluation in 6 months.

## 2023-01-03 DIAGNOSIS — E119 Type 2 diabetes mellitus without complications: Secondary | ICD-10-CM | POA: Diagnosis not present

## 2023-01-03 DIAGNOSIS — H5213 Myopia, bilateral: Secondary | ICD-10-CM | POA: Diagnosis not present

## 2023-01-03 DIAGNOSIS — H52223 Regular astigmatism, bilateral: Secondary | ICD-10-CM | POA: Diagnosis not present

## 2023-01-03 DIAGNOSIS — Z961 Presence of intraocular lens: Secondary | ICD-10-CM | POA: Diagnosis not present

## 2023-01-29 DIAGNOSIS — E1169 Type 2 diabetes mellitus with other specified complication: Secondary | ICD-10-CM | POA: Diagnosis not present

## 2023-01-29 DIAGNOSIS — E785 Hyperlipidemia, unspecified: Secondary | ICD-10-CM | POA: Diagnosis not present

## 2023-01-29 DIAGNOSIS — E559 Vitamin D deficiency, unspecified: Secondary | ICD-10-CM | POA: Diagnosis not present

## 2023-01-29 DIAGNOSIS — M109 Gout, unspecified: Secondary | ICD-10-CM | POA: Diagnosis not present

## 2023-01-29 DIAGNOSIS — Z125 Encounter for screening for malignant neoplasm of prostate: Secondary | ICD-10-CM | POA: Diagnosis not present

## 2023-01-29 DIAGNOSIS — I10 Essential (primary) hypertension: Secondary | ICD-10-CM | POA: Diagnosis not present

## 2023-02-05 DIAGNOSIS — E1169 Type 2 diabetes mellitus with other specified complication: Secondary | ICD-10-CM | POA: Diagnosis not present

## 2023-02-05 DIAGNOSIS — M109 Gout, unspecified: Secondary | ICD-10-CM | POA: Diagnosis not present

## 2023-02-05 DIAGNOSIS — Z1331 Encounter for screening for depression: Secondary | ICD-10-CM | POA: Diagnosis not present

## 2023-02-05 DIAGNOSIS — I10 Essential (primary) hypertension: Secondary | ICD-10-CM | POA: Diagnosis not present

## 2023-02-05 DIAGNOSIS — Z8546 Personal history of malignant neoplasm of prostate: Secondary | ICD-10-CM | POA: Diagnosis not present

## 2023-02-05 DIAGNOSIS — R7989 Other specified abnormal findings of blood chemistry: Secondary | ICD-10-CM | POA: Diagnosis not present

## 2023-02-05 DIAGNOSIS — Z0000 Encounter for general adult medical examination without abnormal findings: Secondary | ICD-10-CM | POA: Diagnosis not present

## 2023-02-05 DIAGNOSIS — E785 Hyperlipidemia, unspecified: Secondary | ICD-10-CM | POA: Diagnosis not present

## 2023-06-25 ENCOUNTER — Ambulatory Visit (INDEPENDENT_AMBULATORY_CARE_PROVIDER_SITE_OTHER): Payer: Medicare PPO | Admitting: Otolaryngology

## 2023-06-25 ENCOUNTER — Encounter (INDEPENDENT_AMBULATORY_CARE_PROVIDER_SITE_OTHER): Payer: Self-pay | Admitting: Otolaryngology

## 2023-06-25 VITALS — BP 130/70 | HR 66 | Ht 68.0 in | Wt 230.0 lb

## 2023-06-25 DIAGNOSIS — J310 Chronic rhinitis: Secondary | ICD-10-CM

## 2023-06-25 DIAGNOSIS — J338 Other polyp of sinus: Secondary | ICD-10-CM

## 2023-06-25 DIAGNOSIS — J321 Chronic frontal sinusitis: Secondary | ICD-10-CM

## 2023-06-25 DIAGNOSIS — R0981 Nasal congestion: Secondary | ICD-10-CM

## 2023-06-25 DIAGNOSIS — J322 Chronic ethmoidal sinusitis: Secondary | ICD-10-CM

## 2023-06-26 MED ORDER — FLUTICASONE PROPIONATE 50 MCG/ACT NA SUSP: 2.0000 | Freq: Every day | NASAL | 10 refills | Status: AC

## 2023-06-26 NOTE — Progress Notes (Signed)
 Patient ID: Andrew Ho, male   DOB: 1946-01-06, 78 y.o.   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  The patient was previously seen for his chronic frontal and ethmoid sinusitis and polyposis.  He underwent bilateral endoscopic sinus surgery in October 2022.  At his last visit in December 2024, his sinus openings were patent.  Only moderate nasal mucosal congestion was noted.  He was treated with Flonase nasal spray daily.  The patient returns today reporting no significant difficulty since his last visit.  He had occasional nasal congestion, especially during the spring allergy season.  He denies any facial pain, fever, or visual change.  He is able to breathe through both nostrils.  Exam: General: Communicates without difficulty, well nourished, no acute distress. Head: Normocephalic, no evidence injury, no tenderness, facial buttresses intact without stepoff. Face/sinus: No tenderness to palpation and percussion. Facial movement is normal and symmetric. Eyes: PERRL, EOMI. No scleral icterus, conjunctivae clear. Neuro: CN II exam reveals vision grossly intact.  No nystagmus at any point of gaze. Ears: Auricles well formed without lesions.  Ear canals are intact without mass or lesion.  No erythema or edema is appreciated.  The TMs are intact without fluid. Nose: External evaluation reveals normal support and skin without lesions.  Dorsum is intact.  Anterior rhinoscopy reveals congested mucosa over anterior aspect of inferior turbinates and intact septum.  No purulence noted. Oral:  Oral cavity and oropharynx are intact, symmetric, without erythema or edema.  Mucosa is moist without lesions. Neck: Full range of motion without pain.  There is no significant lymphadenopathy.  No masses palpable.  Thyroid bed within normal limits to palpation.  Parotid glands and  submandibular glands equal bilaterally without mass.  Trachea is midline. Neuro:  CN 2-12 grossly intact.   Assessment: 1.  Chronic rhinitis with moderate nasal mucosal congestion. 2.  No recurrent sinusitis or polyposis is noted on today's examination.  Plan: 1.  The physical exam findings are reviewed with the patient. 2.  Continue with Flonase nasal spray 2 sprays each nostril daily.  The importance of consistent daily use is discussed. 3.  The patient will return for reevaluation in 6 months, sooner if needed.

## 2023-07-16 DIAGNOSIS — I10 Essential (primary) hypertension: Secondary | ICD-10-CM | POA: Diagnosis not present

## 2023-07-16 DIAGNOSIS — Z0189 Encounter for other specified special examinations: Secondary | ICD-10-CM | POA: Diagnosis not present

## 2023-07-16 DIAGNOSIS — Z1159 Encounter for screening for other viral diseases: Secondary | ICD-10-CM | POA: Diagnosis not present

## 2023-07-16 DIAGNOSIS — E559 Vitamin D deficiency, unspecified: Secondary | ICD-10-CM | POA: Diagnosis not present

## 2023-07-16 DIAGNOSIS — Z79899 Other long term (current) drug therapy: Secondary | ICD-10-CM | POA: Diagnosis not present

## 2023-07-16 DIAGNOSIS — E669 Obesity, unspecified: Secondary | ICD-10-CM | POA: Diagnosis not present

## 2023-07-16 DIAGNOSIS — E119 Type 2 diabetes mellitus without complications: Secondary | ICD-10-CM | POA: Diagnosis not present

## 2023-07-16 DIAGNOSIS — M109 Gout, unspecified: Secondary | ICD-10-CM | POA: Diagnosis not present

## 2023-07-16 DIAGNOSIS — Z136 Encounter for screening for cardiovascular disorders: Secondary | ICD-10-CM | POA: Diagnosis not present

## 2023-12-12 ENCOUNTER — Other Ambulatory Visit (HOSPITAL_BASED_OUTPATIENT_CLINIC_OR_DEPARTMENT_OTHER): Payer: Self-pay

## 2023-12-12 MED ORDER — COMIRNATY 30 MCG/0.3ML IM SUSY
0.3000 mL | PREFILLED_SYRINGE | Freq: Once | INTRAMUSCULAR | 0 refills | Status: AC
  Filled 2023-12-12: qty 0.3, 1d supply, fill #0

## 2023-12-30 ENCOUNTER — Ambulatory Visit (INDEPENDENT_AMBULATORY_CARE_PROVIDER_SITE_OTHER): Admitting: Otolaryngology

## 2024-01-29 ENCOUNTER — Ambulatory Visit (INDEPENDENT_AMBULATORY_CARE_PROVIDER_SITE_OTHER): Admitting: Otolaryngology

## 2024-01-29 ENCOUNTER — Encounter (INDEPENDENT_AMBULATORY_CARE_PROVIDER_SITE_OTHER): Payer: Self-pay | Admitting: Otolaryngology

## 2024-01-29 VITALS — BP 123/71 | HR 66 | Ht 67.0 in | Wt 220.0 lb

## 2024-01-29 DIAGNOSIS — J322 Chronic ethmoidal sinusitis: Secondary | ICD-10-CM

## 2024-01-29 DIAGNOSIS — J310 Chronic rhinitis: Secondary | ICD-10-CM | POA: Diagnosis not present

## 2024-01-29 DIAGNOSIS — J338 Other polyp of sinus: Secondary | ICD-10-CM

## 2024-01-29 DIAGNOSIS — J321 Chronic frontal sinusitis: Secondary | ICD-10-CM

## 2024-01-29 NOTE — Progress Notes (Signed)
 Patient ID: Andrew Ho, male   DOB: Apr 05, 1945, 79 y.o.   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  The patient has a history of chronic frontal and ethmoid sinusitis and polyposis.  He underwent bilateral endoscopic sinus surgery in October 2022.  At his last visit 6 months ago, his sinus openings were patent.  Only moderate nasal mucosal congestion was noted.  He was treated with Flonase  nasal spray daily.  The patient returns today reporting no significant difficulty since his last visit.  He had occasional nasal congestion, especially during the allergy season.  He denies any facial pain, fever, or visual change.  He is able to breathe through both nostrils.  Exam: General: Communicates without difficulty, well nourished, no acute distress. Head: Normocephalic, no evidence injury, no tenderness, facial buttresses intact without stepoff. Face/sinus: No tenderness to palpation and percussion. Facial movement is normal and symmetric. Eyes: PERRL, EOMI. No scleral icterus, conjunctivae clear. Neuro: CN II exam reveals vision grossly intact.  No nystagmus at any point of gaze. Ears: Auricles well formed without lesions.  Ear canals are intact without mass or lesion.  No erythema or edema is appreciated.  The TMs are intact without fluid. Nose: External evaluation reveals normal support and skin without lesions.  Dorsum is intact.  Anterior rhinoscopy reveals congested mucosa over anterior aspect of inferior turbinates and intact septum.  No purulence noted. Oral:  Oral cavity and oropharynx are intact, symmetric, without erythema or edema.  Mucosa is moist without lesions. Neck: Full range of motion without pain.  There is no significant lymphadenopathy.  No masses palpable.  Thyroid bed within normal limits to palpation.  Parotid glands and submandibular glands equal  bilaterally without mass.  Trachea is midline. Neuro:  CN 2-12 grossly intact.   Assessment: 1.  Chronic rhinitis with moderate nasal mucosal congestion. 2.  No recurrent sinusitis or polyposis is noted on today's examination.  Plan: 1.  The physical exam findings are reviewed with the patient. 2.  Continue with Flonase  nasal spray 2 sprays each nostril daily.  The importance of consistent daily use is discussed. 3.  The patient will return for reevaluation in 6 months, sooner if needed.

## 2024-07-30 ENCOUNTER — Ambulatory Visit (INDEPENDENT_AMBULATORY_CARE_PROVIDER_SITE_OTHER): Admitting: Otolaryngology
